# Patient Record
Sex: Female | Born: 1975 | Race: White | Hispanic: No | Marital: Married | Smoking: Never smoker
Health system: Southern US, Community
[De-identification: ages and names within clinical notes are randomized; demographics above are authoritative.]

## PROBLEM LIST (undated history)

## (undated) DIAGNOSIS — G373 Acute transverse myelitis in demyelinating disease of central nervous system: Secondary | ICD-10-CM

## (undated) DIAGNOSIS — E669 Obesity, unspecified: Secondary | ICD-10-CM

## (undated) HISTORY — PX: EYE SURGERY: SHX253

## (undated) HISTORY — PX: DILATION AND CURETTAGE OF UTERUS: SHX78

---

## 2009-03-09 ENCOUNTER — Ambulatory Visit: Payer: Self-pay | Admitting: Family Medicine

## 2009-06-29 ENCOUNTER — Emergency Department: Payer: Self-pay

## 2009-10-18 ENCOUNTER — Emergency Department: Payer: Self-pay | Admitting: Emergency Medicine

## 2010-07-09 ENCOUNTER — Emergency Department: Payer: Self-pay | Admitting: Emergency Medicine

## 2012-08-22 ENCOUNTER — Ambulatory Visit: Payer: Self-pay | Admitting: Family Medicine

## 2014-11-19 ENCOUNTER — Emergency Department: Payer: Self-pay | Admitting: Emergency Medicine

## 2014-11-19 LAB — CBC
HCT: 33.8 % — ABNORMAL LOW (ref 35.0–47.0)
HGB: 10.7 g/dL — ABNORMAL LOW (ref 12.0–16.0)
MCH: 23.6 pg — ABNORMAL LOW (ref 26.0–34.0)
MCHC: 31.5 g/dL — ABNORMAL LOW (ref 32.0–36.0)
MCV: 75 fL — ABNORMAL LOW (ref 80–100)
Platelet: 429 10*3/uL (ref 150–440)
RBC: 4.51 10*6/uL (ref 3.80–5.20)
RDW: 15.6 % — AB (ref 11.5–14.5)
WBC: 15 10*3/uL — ABNORMAL HIGH (ref 3.6–11.0)

## 2014-11-19 LAB — COMPREHENSIVE METABOLIC PANEL
ALBUMIN: 3.5 g/dL (ref 3.4–5.0)
AST: 10 U/L — AB (ref 15–37)
Alkaline Phosphatase: 114 U/L
Anion Gap: 7 (ref 7–16)
BUN: 8 mg/dL (ref 7–18)
Bilirubin,Total: 0.4 mg/dL (ref 0.2–1.0)
CHLORIDE: 106 mmol/L (ref 98–107)
CO2: 24 mmol/L (ref 21–32)
Calcium, Total: 8.7 mg/dL (ref 8.5–10.1)
Creatinine: 0.89 mg/dL (ref 0.60–1.30)
EGFR (African American): >60
Glucose: 103 mg/dL — ABNORMAL HIGH (ref 65–99)
Osmolality: 272 (ref 275–301)
Potassium: 4.1 mmol/L (ref 3.5–5.1)
SGPT (ALT): 21 U/L
Sodium: 137 mmol/L (ref 136–145)
Total Protein: 7.7 g/dL (ref 6.4–8.2)

## 2014-11-19 LAB — URINALYSIS, COMPLETE
BILIRUBIN, UR: NEGATIVE
BLOOD: NEGATIVE
Glucose,UR: NEGATIVE mg/dL (ref 0–75)
Nitrite: NEGATIVE
PROTEIN: NEGATIVE
Ph: 6 (ref 4.5–8.0)
Specific Gravity: 1.021 (ref 1.003–1.030)
WBC UR: 32 /HPF (ref 0–5)

## 2014-11-19 LAB — TROPONIN I

## 2014-11-19 LAB — LIPASE, BLOOD: LIPASE: 153 U/L (ref 73–393)

## 2014-11-22 ENCOUNTER — Emergency Department: Payer: Self-pay | Admitting: Emergency Medicine

## 2014-12-03 ENCOUNTER — Inpatient Hospital Stay (HOSPITAL_COMMUNITY)
Admission: EM | Admit: 2014-12-03 | Discharge: 2014-12-12 | DRG: 059 | Disposition: A | Discharge status: Home / Self Care | Payer: Medicaid Other | Attending: Internal Medicine | Admitting: Internal Medicine

## 2014-12-03 ENCOUNTER — Encounter (HOSPITAL_COMMUNITY): Payer: Self-pay | Admitting: Emergency Medicine

## 2014-12-03 DIAGNOSIS — E669 Obesity, unspecified: Secondary | ICD-10-CM

## 2014-12-03 DIAGNOSIS — R202 Paresthesia of skin: Secondary | ICD-10-CM

## 2014-12-03 DIAGNOSIS — G373 Acute transverse myelitis in demyelinating disease of central nervous system: Secondary | ICD-10-CM

## 2014-12-03 DIAGNOSIS — R7989 Other specified abnormal findings of blood chemistry: Secondary | ICD-10-CM

## 2014-12-03 DIAGNOSIS — G379 Demyelinating disease of central nervous system, unspecified: Principal | ICD-10-CM | POA: Diagnosis present

## 2014-12-03 DIAGNOSIS — G35 Multiple sclerosis: Secondary | ICD-10-CM

## 2014-12-03 DIAGNOSIS — R609 Edema, unspecified: Secondary | ICD-10-CM | POA: Insufficient documentation

## 2014-12-03 DIAGNOSIS — N390 Urinary tract infection, site not specified: Secondary | ICD-10-CM | POA: Insufficient documentation

## 2014-12-03 DIAGNOSIS — B373 Candidiasis of vulva and vagina: Secondary | ICD-10-CM | POA: Diagnosis present

## 2014-12-03 DIAGNOSIS — E282 Polycystic ovarian syndrome: Secondary | ICD-10-CM | POA: Diagnosis present

## 2014-12-03 DIAGNOSIS — Z820 Family history of epilepsy and other diseases of the nervous system: Secondary | ICD-10-CM

## 2014-12-03 DIAGNOSIS — E041 Nontoxic single thyroid nodule: Secondary | ICD-10-CM

## 2014-12-03 DIAGNOSIS — E8770 Fluid overload, unspecified: Secondary | ICD-10-CM | POA: Diagnosis not present

## 2014-12-03 DIAGNOSIS — Z6841 Body Mass Index (BMI) 40.0 and over, adult: Secondary | ICD-10-CM

## 2014-12-03 HISTORY — DX: Obesity, unspecified: E66.9

## 2014-12-03 LAB — COMPREHENSIVE METABOLIC PANEL
ALBUMIN: 3.6 g/dL (ref 3.5–5.2)
ALT: 23 U/L (ref 0–35)
ANION GAP: 7 (ref 5–15)
AST: 22 U/L (ref 0–37)
Alkaline Phosphatase: 91 U/L (ref 39–117)
BUN: 8 mg/dL (ref 6–23)
CO2: 28 mmol/L (ref 19–32)
CREATININE: 0.85 mg/dL (ref 0.50–1.10)
Calcium: 9.1 mg/dL (ref 8.4–10.5)
Chloride: 104 mEq/L (ref 96–112)
GFR calc Af Amer: >90 mL/min (ref >90)
GFR calc non Af Amer: 85 mL/min — ABNORMAL LOW (ref >90)
GLUCOSE: 137 mg/dL — AB (ref 70–99)
Potassium: 4 mmol/L (ref 3.5–5.1)
Sodium: 139 mmol/L (ref 135–145)
Total Bilirubin: 0.7 mg/dL (ref 0.3–1.2)
Total Protein: 6.8 g/dL (ref 6.0–8.3)

## 2014-12-03 LAB — CBC WITH DIFFERENTIAL/PLATELET
Basophils Absolute: 0 10*3/uL (ref 0.0–0.1)
Basophils Relative: 0 % (ref 0–1)
EOS ABS: 0.1 10*3/uL (ref 0.0–0.7)
Eosinophils Relative: 1 % (ref 0–5)
HCT: 33.7 % — ABNORMAL LOW (ref 36.0–46.0)
HEMOGLOBIN: 10.4 g/dL — AB (ref 12.0–15.0)
LYMPHS PCT: 25 % (ref 12–46)
Lymphs Abs: 2.3 10*3/uL (ref 0.7–4.0)
MCH: 23.9 pg — ABNORMAL LOW (ref 26.0–34.0)
MCHC: 30.9 g/dL (ref 30.0–36.0)
MCV: 77.3 fL — AB (ref 78.0–100.0)
MONOS PCT: 7 % (ref 3–12)
Monocytes Absolute: 0.6 10*3/uL (ref 0.1–1.0)
Neutro Abs: 6.2 10*3/uL (ref 1.7–7.7)
Neutrophils Relative %: 67 % (ref 43–77)
Platelets: 386 10*3/uL (ref 150–400)
RBC: 4.36 MIL/uL (ref 3.87–5.11)
RDW: 15.3 % (ref 11.5–15.5)
WBC: 9.2 10*3/uL (ref 4.0–10.5)

## 2014-12-03 NOTE — ED Notes (Signed)
Pt. reports numbness at lower torso , arms /legs and feet numbness and generalized weakness onset 3 weeks ago , ambulatory , denies fever , respirations unlabored .

## 2014-12-03 NOTE — ED Provider Notes (Signed)
CSN: [id number]     Arrival date & time 12/03/14  2127 History   First MD Initiated Contact with Patient 12/03/14 2214     Chief Complaint  Patient presents with  . Numbness     (Consider location/radiation/quality/duration/timing/severity/associated sxs/prior Treatment) HPI  PCP: Pcp Not In System Blood pressure 146/83, pulse 82, temperature 98.2 F (36.8 C), temperature source Oral, resp. rate 16, height  (1.626 m), weight 300 lb (136.079 kg), last menstrual period 11/26/2014, SpO2 99 %.  Jessica Cisneros is a 39 y.o.female without any significant PMH presents to the ER with complaints of paresthesias that started 3 weeks ago. The numbness/tingling started in her vaginal area, spread to her buttocks then down to her feet where it has progressively been moving up. Tonight, the symptoms moved up into her chest- which is why she presented to the ED.  She has been seen at [facility] x 2 for this. She reports having lab work done; diagnosed with UTI and vaginal yeast infection. She has finished treatment for this and was referred to Neuro both times if she did not feel better. She then followed up with her PCP who drew blood work, which reportedly was normal, and referred to Neurology. [facility] [place] [facility] said she did not know the etiology of her symptoms.  She called him this evening when the symptoms moved up into her chest and he told her to go to [facility] in [place], [place] to see our neurologist. She has not had any Head CT or MRI. She reports her neurologist told her to mention demyelinating disease and guillain barre.   Notably, she denies having any weakness or injury. She is ambulatory but restless. She has not had any fevers, headaches, nausea, vomiting or diarrhea. Denies any PMH.   Past Medical History  Diagnosis Date  . Obesity    Past Surgical History  Procedure Laterality Date  . Eye surgery    . Dilation and curettage  of uterus     No family history on file. History  Substance Use Topics  . Smoking status: Never Smoker   . Smokeless tobacco: Not on file  . Alcohol Use: No   OB History    No data available     Review of Systems  10 Systems reviewed and are negative for acute change except as noted in the HPI.   Allergies  Review of patient's allergies indicates no known allergies.  Home Medications   Prior to Admission medications   Not on File   BP 146/83 mmHg  Pulse 82  Temp(Src) 98.2 F (36.8 C) (Oral)  Resp 16  Ht  (1.626 m)  Wt 300 lb (136.079 kg)  BMI 51.47 kg/m2  SpO2 99%  LMP 11/26/2014 Physical Exam  Constitutional: She appears well-developed and well-nourished. No distress.  HENT:  Head: Normocephalic and atraumatic.  Eyes: Pupils are equal, round, and reactive to light.  Neck: Normal range of motion. Neck supple.  Cardiovascular: Normal rate and regular rhythm.   Pulmonary/Chest: Effort normal.  Abdominal: Soft.  Neurological: She is alert.  Cranial nerves II-VIII and X-XII evaluated and show no deficits. Pt alert and oriented x 3 Upper and lower extremity strength is symmetrical and physiologic Normal muscular tone No facial droop Coordination intact Rapid alternating movements normal  Skin: Skin is warm and dry.  Nursing note and vitals reviewed.   ED Course  Procedures (including critical care time) Labs Review  Labs Reviewed  CBC WITH DIFFERENTIAL - Abnormal; Notable for the following:    Hemoglobin 10.4 (*)    HCT 33.7 (*)    MCV 77.3 (*)    MCH 23.9 (*)    All other components within normal limits  COMPREHENSIVE METABOLIC PANEL - Abnormal; Notable for the following:    Glucose, Bld 137 (*)    GFR calc non Af Amer 85 (*)    All other components within normal limits    Imaging Review No results found.   EKG Interpretation None      MDM   Final diagnoses:  Paresthesias   11:00 pm I spoke with Dr. Cyril Mourning- [facility]. He as agreed to come see patient in the ED. She most likely will need MRI of brain and spinal cord. MRI is no longer here and pt will most likely need admission, will wait for recommendations.  11:30 pm Dr. Cyril Mourning has seen patient and has agreed to formally consult and follow patient.He requests patient being admitted to the medicine service  For head and spine MRI.  Patient is aware of the plan, that she needs to stay overnight and have MRIs done in the morning.  Filed Vitals:   12/03/14 2131  BP: 146/83  Pulse: 82  Temp: 98.2 F (36.8 C)  Resp: 16    I spoke with Dr. Nedra Hai who has agreed to admit the patient. He will put in orders.  Dorthula Matas, PA-C 12/03/14 2340  Toy Baker, MD 12/06/14 [id number]

## 2014-12-03 NOTE — Consult Note (Signed)
NEURO HOSPITALIST CONSULT NOTE    Reason for Consult: paresthesias legs-arms-genitalia area  HPI:                                                                                                                                          Jessica Cisneros is an 39 y.o. female without significant past medical history except for obesity, comes in for further evaluation of the above stated symptoms. She indicated that approximately 3 weeks ago, couple of days after a cold infection, she developed gradual onset of numbness-pins and needles in her genitalia area that subsequently progressed and traveled to her buttocks, feet, legs, and now anterior chest and arms. These symptoms have been pretty much constant ever since, perhaps less prominent around the genitalia.  No associated pain, bladder or bowel impairment, legs or arms weakness, HA, vertigo, double vision, difficultly swallowing, slurred speech, language or vision impairment. Michela Pitcher that she feels " a little bit off balance mainly because my legs are numb". Stated that she was seen by her neurologist about 1 week ago and MRI was planned. She called him this evening when the symptoms moved up into her chest and he told her to go to [facility] in [place], [place] to see our neurologist No recent fever, vaccinations, or rash.  Past Medical History  Diagnosis Date  . Obesity     Past Surgical History  Procedure Laterality Date  . Eye surgery    . Dilation and curettage of uterus      No family history on file.  Family History: no brain tumors, epilepsy, brain aneurysms, or multiple sclerosis   Social History:  reports that she has never smoked. She does not have any smokeless tobacco history on file. She reports that she does not drink alcohol or use illicit drugs.  No Known Allergies  MEDICATIONS:                                                                                                                     I  have reviewed the patient's current medications.   ROS:  History obtained from the patient  General ROS: negative for - chills, fatigue, fever, night sweats, or weight loss Psychological ROS: negative for - behavioral disorder, hallucinations, memory difficulties, mood swings or suicidal ideation Ophthalmic ROS: negative for - blurry vision, double vision, eye pain or loss of vision ENT ROS: negative for - epistaxis, nasal discharge, oral lesions, sore throat, tinnitus or vertigo Allergy and Immunology ROS: negative for - hives or itchy/watery eyes Hematological and Lymphatic ROS: negative for - bleeding problems, bruising or swollen lymph nodes Endocrine ROS: negative for - galactorrhea, hair pattern changes, polydipsia/polyuria or temperature intolerance Respiratory ROS: negative for - cough, hemoptysis, shortness of breath or wheezing Cardiovascular ROS: negative for - chest pain, dyspnea on exertion, edema or irregular heartbeat Gastrointestinal ROS: negative for - abdominal pain, diarrhea, hematemesis, nausea/vomiting or stool incontinence Genito-Urinary ROS: negative for - dysuria, hematuria, incontinence or urinary frequency/urgency Musculoskeletal ROS: negative for - joint swelling or muscular weakness Neurological ROS: as noted in HPI Dermatological ROS: negative for rash and skin lesion changes   Physical exam: pleasant female in no apparent distress. Blood pressure 146/83, pulse 82, temperature 98.2 F (36.8 C), temperature source Oral, resp. rate 16, height '5\' 4"'  (1.626 m), weight 136.079 kg (300 lb), last menstrual period 11/26/2014, SpO2 99 %. Head: normocephalic. Neck: supple, no bruits, no JVD. Cardiac: no murmurs. Lungs: clear. Abdomen: soft, no tender, no mass. Extremities: no edema. Skin: no rash CV: pulses palpable throughout   Neurologic Examination:                                                                                                      General: Mental Status: Alert, oriented, thought content appropriate.  Speech fluent without evidence of aphasia.  Able to follow 3 step commands without difficulty. Cranial Nerves: II: Discs flat bilaterally; Visual fields grossly normal, pupils equal, round, reactive to light and accommodation III,IV, VI: ptosis not present, extra-ocular motions intact bilaterally V,VII: smile symmetric, facial light touch sensation normal bilaterally VIII: hearing normal bilaterally IX,X: gag reflex present XI: bilateral shoulder shrug XII: midline tongue extension without atrophy or fasciculations Motor: Right : Upper extremity   5/5    Left:     Upper extremity   5/5  Lower extremity   5/5     Lower extremity   5/5 Tone and bulk:normal tone throughout; no atrophy noted Sensory: Pinprick intact but light touch " feels different" in the legs. Deep Tendon Reflexes:  Right: Upper Extremity   Left: Upper extremity   biceps (C-5 to C-6) 2/4   biceps (C-5 to C-6) 2/4 tricep (C7) 2/4    triceps (C7) 2/4 Brachioradialis (C6) 2/4  Brachioradialis (C6) 2/4  Lower Extremity Lower Extremity  quadriceps (L-2 to L-4) 2/4   quadriceps (L-2 to L-4) 2/4 Achilles (S1) 2/4   Achilles (S1) 2/4  Plantars: Right: downgoing   Left: downgoing Cerebellar: normal finger-to-nose,  normal heel-to-shin test Gait:  No tested for safety reasons    No results found for: CHOL  Results for orders placed or performed during the hospital encounter of  12/03/14 (from the past 48 hour(s))  CBC with Differential     Status: Abnormal   Collection Time: 12/03/14  9:33 PM  Result Value Ref Range   WBC 9.2 4.0 - 10.5 K/uL   RBC 4.36 3.87 - 5.11 MIL/uL   Hemoglobin 10.4 (L) 12.0 - 15.0 g/dL   HCT 33.7 (L) 36.0 - 46.0 %   MCV 77.3 (L) 78.0 - 100.0 fL   MCH 23.9 (L) 26.0 - 34.0 pg   MCHC 30.9 30.0 -  36.0 g/dL   RDW 15.3 11.5 - 15.5 %   Platelets 386 150 - 400 K/uL   Neutrophils Relative % 67 43 - 77 %   Neutro Abs 6.2 1.7 - 7.7 K/uL   Lymphocytes Relative 25 12 - 46 %   Lymphs Abs 2.3 0.7 - 4.0 K/uL   Monocytes Relative 7 3 - 12 %   Monocytes Absolute 0.6 0.1 - 1.0 K/uL   Eosinophils Relative 1 0 - 5 %   Eosinophils Absolute 0.1 0.0 - 0.7 K/uL   Basophils Relative 0 0 - 1 %   Basophils Absolute 0.0 0.0 - 0.1 K/uL  Comprehensive metabolic panel     Status: Abnormal   Collection Time: 12/03/14  9:33 PM  Result Value Ref Range   Sodium 139 135 - 145 mmol/L    Comment: Please note change in reference range.   Potassium 4.0 3.5 - 5.1 mmol/L    Comment: Please note change in reference range.   Chloride 104 96 - 112 mEq/L   CO2 28 19 - 32 mmol/L   Glucose, Bld 137 (H) 70 - 99 mg/dL   BUN 8 6 - 23 mg/dL   Creatinine, Ser 0.85 0.50 - 1.10 mg/dL   Calcium 9.1 8.4 - 10.5 mg/dL   Total Protein 6.8 6.0 - 8.3 g/dL   Albumin 3.6 3.5 - 5.2 g/dL   AST 22 0 - 37 U/L   ALT 23 0 - 35 U/L   Alkaline Phosphatase 91 39 - 117 U/L   Total Bilirubin 0.7 0.3 - 1.2 mg/dL   GFR calc non Af Amer 85 (L) >90 mL/min   GFR calc Af Amer >90 >90 mL/min    Comment: (NOTE) The eGFR has been calculated using the CKD EPI equation. This calculation has not been validated in all clinical situations. eGFR's persistently <90 mL/min signify possible Chronic Kidney Disease.    Anion gap 7 5 - 15    No results found.  Assessment/Plan: 39 years old obese female with new onset paresthesias for the past weeks that initially involved genitalia-buttocks-legs-arms and today moved to her chest. Neuro-exam is unimpressive, preserved DTR's. Of note, she claims that her symptoms developed couple of days after a cold. At this age a demyelinating disorder must be ruled out. Other inflammatory cord disorders also in the differential. Intrinsic primary cord mass less likely. I ordered MRI brain and cervico-thoracic cord  with and without contrast. Will follow up.  Dorian Pod, MD 12/03/2014, 11:30 PM  [facility]

## 2014-12-04 ENCOUNTER — Observation Stay (HOSPITAL_COMMUNITY): Payer: Medicaid Other

## 2014-12-04 ENCOUNTER — Encounter (HOSPITAL_COMMUNITY): Payer: Self-pay | Admitting: Internal Medicine

## 2014-12-04 DIAGNOSIS — E282 Polycystic ovarian syndrome: Secondary | ICD-10-CM | POA: Diagnosis present

## 2014-12-04 DIAGNOSIS — E669 Obesity, unspecified: Secondary | ICD-10-CM

## 2014-12-04 DIAGNOSIS — R202 Paresthesia of skin: Secondary | ICD-10-CM

## 2014-12-04 DIAGNOSIS — G0489 Other myelitis: Secondary | ICD-10-CM

## 2014-12-04 LAB — CSF CELL COUNT WITH DIFFERENTIAL
RBC COUNT CSF: 0 /mm3
Tube #: 3
WBC, CSF: 7 /mm3 — ABNORMAL HIGH (ref 0–5)

## 2014-12-04 LAB — PROTEIN, CSF: Total  Protein, CSF: 39 mg/dL (ref 15–45)

## 2014-12-04 LAB — RAPID URINE DRUG SCREEN, HOSP PERFORMED
AMPHETAMINES: NOT DETECTED
BARBITURATES: NOT DETECTED
Benzodiazepines: NOT DETECTED
Cocaine: NOT DETECTED
Opiates: NOT DETECTED
TETRAHYDROCANNABINOL: NOT DETECTED

## 2014-12-04 LAB — GLUCOSE, CSF: GLUCOSE CSF: 67 mg/dL (ref 43–76)

## 2014-12-04 LAB — GRAM STAIN

## 2014-12-04 LAB — CBC
HCT: 33.1 % — ABNORMAL LOW (ref 36.0–46.0)
Hemoglobin: 10.3 g/dL — ABNORMAL LOW (ref 12.0–15.0)
MCH: 24 pg — ABNORMAL LOW (ref 26.0–34.0)
MCHC: 31.1 g/dL (ref 30.0–36.0)
MCV: 77 fL — ABNORMAL LOW (ref 78.0–100.0)
Platelets: 345 10*3/uL (ref 150–400)
RBC: 4.3 MIL/uL (ref 3.87–5.11)
RDW: 15.3 % (ref 11.5–15.5)
WBC: 9 10*3/uL (ref 4.0–10.5)

## 2014-12-04 LAB — HCG, QUANTITATIVE, PREGNANCY

## 2014-12-04 LAB — VITAMIN B12: Vitamin B-12: 373 pg/mL (ref 211–911)

## 2014-12-04 LAB — TSH: TSH: 2.041 u[IU]/mL (ref 0.350–4.500)

## 2014-12-04 LAB — SEDIMENTATION RATE: Sed Rate: 27 mm/hr — ABNORMAL HIGH (ref 0–22)

## 2014-12-04 LAB — CREATININE, SERUM
Creatinine, Ser: 0.81 mg/dL (ref 0.50–1.10)
GFR calc Af Amer: >90 mL/min (ref >90)

## 2014-12-04 LAB — TESTOSTERONE: Testosterone: 44 ng/dL (ref 10–70)

## 2014-12-04 LAB — C-REACTIVE PROTEIN: CRP: 0.8 mg/dL — AB (ref <0.60)

## 2014-12-04 MED ORDER — HEPARIN SODIUM (PORCINE) 5000 UNIT/ML IJ SOLN
5000.0000 [IU] | Freq: Three times a day (TID) | INTRAMUSCULAR | Status: DC
  Administered 2014-12-04 – 2014-12-08 (×12): 5000 [IU] via SUBCUTANEOUS
  Filled 2014-12-04 (×12): qty 1

## 2014-12-04 MED ORDER — NYSTATIN 100000 UNIT/GM EX POWD
Freq: Two times a day (BID) | CUTANEOUS | Status: DC
  Administered 2014-12-04 – 2014-12-12 (×17): via TOPICAL
  Filled 2014-12-04 (×2): qty 15

## 2014-12-04 MED ORDER — GADOBENATE DIMEGLUMINE 529 MG/ML IV SOLN
20.0000 mL | Freq: Once | INTRAVENOUS | Status: AC | PRN
  Administered 2014-12-04: 20 mL via INTRAVENOUS

## 2014-12-04 MED ORDER — SODIUM CHLORIDE 0.9 % IV SOLN
1000.0000 mg | Freq: Every day | INTRAVENOUS | Status: AC
  Administered 2014-12-04 – 2014-12-08 (×5): 1000 mg via INTRAVENOUS
  Filled 2014-12-04 (×6): qty 8

## 2014-12-04 MED ORDER — HEPARIN SODIUM (PORCINE) 5000 UNIT/ML IJ SOLN: 5000.0000 [IU] | Freq: Three times a day (TID) | INTRAMUSCULAR | Status: DC

## 2014-12-04 MED ORDER — PANTOPRAZOLE SODIUM 40 MG IV SOLR
40.0000 mg | INTRAVENOUS | Status: DC
  Administered 2014-12-04 – 2014-12-05 (×2): 40 mg via INTRAVENOUS
  Filled 2014-12-04 (×2): qty 40

## 2014-12-04 NOTE — Progress Notes (Signed)
Patient Demographics  Jessica Cisneros, is a 39 y.o. female, DOB - [birth year], ZOX:[id number]  Admit date - 12/03/2014   Admitting Physician Houston Siren, MD  Outpatient Primary MD for the patient is Pcp Not In System  LOS - 1   Chief Complaint  Patient presents with  . Numbness       Admission history of present illness/brief narrative:  Jessica Cisneros is an 39 y.o. female  presents to the ER complaining of feeling numbness and tingling starting on her hip and progressing cephalad and now on her chest. She denied weakness, pain, blurry vision, stiffneck, fever, or chills. She has some HA, but no stiffneck. She denied any rash.  Evaluation in the ER showed normal serology. Neurology was consulted, recommended hospitalist to admit for further work up to include imaging. Her mother has Multiple Sclerosis. She has no weakness nor pain. 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  Subjective:   Jessica Cisneros today has, No headache, No chest pain, No abdominal pain - No Nausea, No new weakness tingling or numbness, No Cough - SOB.   Assessment & Plan    Active Problems:   Paresthesia   PCOS (polycystic ovarian syndrome)   Obesity  Paresthesia,  - MRI showing evidence of acute demyelination in C4-C6 . - Neurology on board,  -LP showing normal protein level and normal cell count. - Started on IV steroids by in neurology for possible autoimmune disorders ( transverse myelitis?) - TSH within normal limits, follow on RPR, Lyme titer, B-12.      Code Status: Full  Family Communication: Family  at bedside  Disposition Plan: Mains inpatient   Procedures  LP 1/22   Consults   Neurology   Medications  Scheduled Meds: . methylPREDNISolone (SOLU-MEDROL) injection  1,000 mg Intravenous Daily  . nystatin   Topical BID  . pantoprazole (PROTONIX) IV  40 mg Intravenous Q24H   Continuous Infusions:  PRN Meds:.  DVT Prophylaxis  subcutaneous heparin  Lab Results  Component Value Date   PLT 345 12/04/2014    Antibiotics    Anti-infectives    None          Objective:   Filed Vitals:   12/04/14 0030 12/04/14 0110 12/04/14 0455 12/04/14 1339  BP: 116/57  119/47 116/56  Pulse: 79  83 80  Temp:  98.1 F (36.7 C) 97.8 F (36.6 C) 99.5 F (37.5 C)  TempSrc:   Oral Other (Comment)  Resp: 23  20 20   Height:      Weight:      SpO2: 100%  97% 99%    Wt Readings from Last 3 Encounters:  12/03/14 136.079 kg (300 lb)     Intake/Output Summary (Last 24 hours) at 12/04/14 1538 Last data filed at 12/04/14 0840  Gross per 24 hour  Intake    200 ml  Output    400 ml  Net   -200 ml     Physical Exam  Awake Alert, Oriented X 3, No new F.N deficits, Normal affect [place].AT,PERRAL Supple Neck,No JVD, No cervical lymphadenopathy appriciated.  Symmetrical Chest wall movement, Good air movement bilaterally, CTAB RRR,No Gallops,Rubs or new Murmurs, No Parasternal Heave +ve B.Sounds, Abd Soft, No tenderness, No organomegaly appriciated, No rebound - guarding or rigidity. No Cyanosis, Clubbing or edema, No new Rash or bruise     Data Review   Micro Results Recent Results (from the past 240 hour(s))  Gram stain     Status: None   Collection Time: 12/04/14 12:32 PM  Result Value Ref Range Status   Specimen Description CSF  Final   Special Requests NONE  Final   Gram Stain   Final    WBC PRESENT, PREDOMINANTLY MONONUCLEAR NO ORGANISMS SEEN CYTOSPIN SLIDE    Report Status 12/04/2014 FINAL  Final    Radiology Reports Mr Laqueta Jean Wo Contrast  12/04/2014    CLINICAL DATA:  Numbness and tingling in hips progressing to chest bilaterally over 3 weeks.  EXAM: MRI HEAD WITHOUT AND WITH CONTRAST  MRI CERVICAL SPINE WITHOUT AND WITH CONTRAST  MRI THORACIC SPINE WITHOUT AND WITH CONTRAST  TECHNIQUE: Multiplanar, multiecho pulse sequences of the brain and surrounding structures, cervical and thoracic spine, to include the craniocervical junction, were obtained without and with intravenous contrast.  CONTRAST:  20mL MULTIHANCE GADOBENATE DIMEGLUMINE 529 MG/ML IV SOLN chest radiograph November 19, 2014 and lumbar spine radiographs November 19, 2014  COMPARISON:  None.  FINDINGS: MRI HEAD FINDINGS  The ventricles and sulci are normal for patient's age. No abnormal parenchymal signal, mass lesions, mass effect. No abnormal parenchymal enhancement. No reduced diffusion to suggest acute ischemia. No susceptibility artifact to suggest hemorrhage.  No abnormal extra-axial fluid collections. No extra-axial masses nor leptomeningeal enhancement. Normal major intracranial vascular flow voids seen at the skull base.  Ocular globes and orbital contents are unremarkable though not tailored for evaluation. No abnormal sellar expansion. Visualized paranasal sinuses and mastoid air cells are well-aerated. No suspicious calvarial bone marrow signal. No abnormal sellar expansion. Craniocervical junction maintained.  MRI CERVICAL SPINE FINDINGS  Expansile cervical spinal cord T2 bright, T1 hypointense lesion with homogeneous enhancement from C4 through C6, measuring 4.1 cm in craniocaudad dimension, with relative sparing of the lateral columns. No additional cervical spinal cord lesions identified. No syrinx. No abnormal leptomeningeal nor epidural enhancement.  Cervical vertebral bodies intact, straightened cervical lordosis. 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 Intervertebral discs demonstrate generally normal morphology, slightly decreased T2 signal consistent with desiccation. Mild  subacute discogenic endplate changes C3-4, C5-6 and a lesser extent at C6-7 without STIR signal abnormality to suggest acute osseous process. No abnormal osseous or intradiscal enhancement.  2.3 x 1.9 cm dominant LEFT thyroid nodule.  Level by level evaluation:  C2-3:  No disc bulge, canal stenosis or neural foraminal narrowing.  C3-4: 2 mm broad-based disc bulge, uncovertebral hypertrophy and mild LEFT facet arthropathy. Mild canal stenosis, moderate to severe LEFT neural foraminal narrowing.  C4-5: 1-2 mm broad-based disc bulge, uncovertebral hypertrophy and mild facet arthropathy. Mild canal stenosis, mild neural foraminal narrowing.  C5-6: 1-2 mm broad-based disc bulge, uncovertebral hypertrophy. Mild canal stenosis. Mild to moderate LEFT neural foraminal narrowing.  C6-7: Annular bulging, uncovertebral hypertrophy without canal stenosis or neural foraminal narrowing.  C7-T1: No disc bulge, canal stenosis nor neural foraminal narrowing.  MRI THORACIC SPINE FINDINGS  Thoracic vertebral bodies and posterior elements intact aligned 1 maintenance of thoracic kyphosis. Mild T7-8 disc height loss, with decreased  T2 signal suggesting mild desiccation. Mild acute enhancing discogenic endplate change at T5-6, subacute discogenic endplate changes T7-8. No STIR signal abnormality to suggest fracture. No abnormal osseous or intradiscal enhancement.  Thoracic spinal cord appears normal morphology and signal characteristics the conus medullaris which terminates at T12-L1. No abnormal cord, leptomeningeal or epidural enhancement. Prevertebral and paraspinal soft tissues are normal.  Tiny T5-6 central disc protrusion. 2 mm RIGHT T7 suspected disc extrusion partially characterized due to slice thickness. No canal stenosis or neural foraminal narrowing at any thoracic level.  IMPRESSION: MRI HEAD  Normal MRI of the brain without without contrast.  MRI CERVICAL SPINE  Expansile enhancing solitary cervical spinal cord lesion  spanning C4 through C6 with imaging characteristics of acute demyelination.  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 Neural foraminal narrowing C3-4 through C5-6: Moderate to severe on the LEFT at C3-4.  2.3 cm LEFT thyroid nodule for which follow up thyroid sonogram is recommended on a nonemergent basis.  MRI THORACIC SPINE  No MR findings of demyelination in the thoracic spinal cord.  Mild degenerative change of thoracic spine without canal stenosis or neural foraminal narrowing.   Electronically Signed   By: Awilda Metro   On: 12/04/2014 04:49   Mr Cervical Spine W Wo Contrast  12/04/2014   CLINICAL DATA:  Numbness and tingling in hips progressing to chest bilaterally over 3 weeks.  EXAM: MRI HEAD WITHOUT AND WITH CONTRAST  MRI CERVICAL SPINE WITHOUT AND WITH CONTRAST  MRI THORACIC SPINE WITHOUT AND WITH CONTRAST  TECHNIQUE: Multiplanar, multiecho pulse sequences of the brain and surrounding structures, cervical and thoracic spine, to include the craniocervical junction, were obtained without and with intravenous contrast.  CONTRAST:  20mL MULTIHANCE GADOBENATE DIMEGLUMINE 529 MG/ML IV SOLN chest radiograph November 19, 2014 and lumbar spine radiographs November 19, 2014  COMPARISON:  None.  FINDINGS: MRI HEAD FINDINGS  The ventricles and sulci are normal for patient's age. No abnormal parenchymal signal, mass lesions, mass effect. No abnormal parenchymal enhancement. No reduced diffusion to suggest acute ischemia. No susceptibility artifact to suggest hemorrhage.  No abnormal extra-axial fluid collections. No extra-axial masses nor leptomeningeal enhancement. Normal major intracranial vascular flow voids seen at the skull base.  Ocular globes and orbital contents are unremarkable though not tailored for evaluation. No abnormal sellar expansion. Visualized paranasal sinuses and mastoid air cells are well-aerated. No suspicious  calvarial bone marrow signal. No abnormal sellar expansion. Craniocervical junction maintained.  MRI CERVICAL SPINE FINDINGS  Expansile cervical spinal cord T2 bright, T1 hypointense lesion with homogeneous enhancement from C4 through C6, measuring 4.1 cm in craniocaudad dimension, with relative sparing of the lateral columns. No additional cervical spinal cord lesions identified. No syrinx. No abnormal leptomeningeal nor epidural enhancement.  Cervical vertebral bodies intact, straightened cervical lordosis. 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 Intervertebral discs demonstrate generally normal morphology, slightly decreased T2 signal consistent with desiccation. Mild subacute discogenic endplate changes C3-4, C5-6 and a lesser extent at C6-7 without STIR signal abnormality to suggest acute osseous process. No abnormal osseous or intradiscal enhancement.  2.3 x 1.9 cm dominant LEFT thyroid nodule.  Level by level evaluation:  C2-3:  No disc bulge, canal stenosis or neural foraminal narrowing.  C3-4: 2 mm broad-based disc bulge, uncovertebral hypertrophy and mild LEFT facet arthropathy. Mild canal stenosis, moderate to severe LEFT neural foraminal narrowing.  C4-5: 1-2 mm broad-based disc bulge, uncovertebral hypertrophy and mild facet arthropathy. Mild  canal stenosis, mild neural foraminal narrowing.  C5-6: 1-2 mm broad-based disc bulge, uncovertebral hypertrophy. Mild canal stenosis. Mild to moderate LEFT neural foraminal narrowing.  C6-7: Annular bulging, uncovertebral hypertrophy without canal stenosis or neural foraminal narrowing.  C7-T1: No disc bulge, canal stenosis nor neural foraminal narrowing.  MRI THORACIC SPINE FINDINGS  Thoracic vertebral bodies and posterior elements intact aligned 1 maintenance of thoracic kyphosis. Mild T7-8 disc height loss, with decreased T2 signal suggesting mild desiccation. Mild acute enhancing discogenic endplate change at T5-6, subacute discogenic endplate  changes K9-3. No STIR signal abnormality to suggest fracture. No abnormal osseous or intradiscal enhancement.  Thoracic spinal cord appears normal morphology and signal characteristics the conus medullaris which terminates at T12-L1. No abnormal cord, leptomeningeal or epidural enhancement. Prevertebral and paraspinal soft tissues are normal.  Tiny T5-6 central disc protrusion. 2 mm RIGHT T7 suspected disc extrusion partially characterized due to slice thickness. No canal stenosis or neural foraminal narrowing at any thoracic level.  IMPRESSION: MRI HEAD  Normal MRI of the brain without without contrast.  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 Neural foraminal narrowing C3-4 through C5-6: Moderate to severe on the LEFT at C3-4.  2.3 cm LEFT thyroid nodule for which follow up thyroid sonogram is recommended on a nonemergent basis.  MRI THORACIC SPINE  No MR findings of demyelination in the thoracic spinal cord.  Mild degenerative change of thoracic spine without canal stenosis or neural foraminal narrowing.   Electronically Signed   By: Awilda Metro   On: 12/04/2014 04:49   Mr Thoracic Spine W Wo Contrast  12/04/2014   CLINICAL DATA:  Numbness and tingling in hips progressing to chest bilaterally over 3 weeks.  EXAM: MRI HEAD WITHOUT AND WITH CONTRAST  MRI CERVICAL SPINE WITHOUT AND WITH CONTRAST  MRI THORACIC SPINE WITHOUT AND WITH CONTRAST  TECHNIQUE: Multiplanar, multiecho pulse sequences of the brain and surrounding structures, cervical and thoracic spine, to include the craniocervical junction, were obtained without and with intravenous contrast.  CONTRAST:  91mL MULTIHANCE GADOBENATE DIMEGLUMINE 529 MG/ML IV SOLN chest radiograph November 19, 2014 and lumbar spine radiographs  November 19, 2014  COMPARISON:  None.  FINDINGS: MRI HEAD FINDINGS  The ventricles and sulci are normal for patient's age. No abnormal parenchymal signal, mass lesions, mass effect. No abnormal parenchymal enhancement. No reduced diffusion to suggest acute ischemia. No susceptibility artifact to suggest hemorrhage.  No abnormal extra-axial fluid collections. No extra-axial masses nor leptomeningeal enhancement. Normal major intracranial vascular flow voids seen at the skull base.  Ocular globes and orbital contents are unremarkable though not tailored for evaluation. No abnormal sellar expansion. Visualized paranasal sinuses and mastoid air cells are well-aerated. No suspicious calvarial bone marrow signal. No abnormal sellar expansion. Craniocervical junction maintained.  MRI CERVICAL SPINE FINDINGS  Expansile cervical spinal cord T2 bright, T1 hypointense lesion with homogeneous enhancement from C4 through C6, measuring 4.1 cm in craniocaudad dimension, with relative sparing of the lateral columns. No additional cervical spinal cord lesions identified. No syrinx. No abnormal leptomeningeal nor epidural enhancement.  Cervical vertebral bodies intact, straightened cervical lordosis. 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 Intervertebral discs demonstrate generally normal morphology, slightly decreased T2 signal consistent with desiccation. Mild subacute discogenic endplate changes C3-4, C5-6 and a lesser extent at C6-7 without STIR signal abnormality  to suggest acute osseous process. No abnormal osseous or intradiscal enhancement.  2.3 x 1.9 cm dominant LEFT thyroid nodule.  Level by level evaluation:  C2-3:  No disc bulge, canal stenosis or neural foraminal narrowing.  C3-4: 2 mm broad-based disc bulge, uncovertebral hypertrophy and mild LEFT facet arthropathy. Mild canal stenosis, moderate to severe LEFT neural foraminal narrowing.  C4-5: 1-2 mm broad-based disc bulge, uncovertebral hypertrophy and mild  facet arthropathy. Mild canal stenosis, mild neural foraminal narrowing.  C5-6: 1-2 mm broad-based disc bulge, uncovertebral hypertrophy. Mild canal stenosis. Mild to moderate LEFT neural foraminal narrowing.  C6-7: Annular bulging, uncovertebral hypertrophy without canal stenosis or neural foraminal narrowing.  C7-T1: No disc bulge, canal stenosis nor neural foraminal narrowing.  MRI THORACIC SPINE FINDINGS  Thoracic vertebral bodies and posterior elements intact aligned 1 maintenance of thoracic kyphosis. Mild T7-8 disc height loss, with decreased T2 signal suggesting mild desiccation. Mild acute enhancing discogenic endplate change at T5-6, subacute discogenic endplate changes T7-8. No STIR signal abnormality to suggest fracture. No abnormal osseous or intradiscal enhancement.  Thoracic spinal cord appears normal morphology and signal characteristics the conus medullaris which terminates at T12-L1. No abnormal cord, leptomeningeal or epidural enhancement. Prevertebral and paraspinal soft tissues are normal.  Tiny T5-6 central disc protrusion. 2 mm RIGHT T7 suspected disc extrusion partially characterized due to slice thickness. No canal stenosis or neural foraminal narrowing at any thoracic level.  IMPRESSION: MRI HEAD  Normal MRI of the brain without without contrast.  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 Neural foraminal narrowing C3-4 through C5-6: Moderate to severe on the LEFT at C3-4.  2.3 cm LEFT thyroid nodule for which follow up thyroid sonogram is recommended on a nonemergent basis.  MRI THORACIC SPINE  No MR findings of demyelination in the thoracic spinal cord.  Mild degenerative change of thoracic spine without canal stenosis or neural foraminal narrowing.   Electronically  Signed   By: Awilda Metro   On: 12/04/2014 04:49    CBC  Recent Labs Lab 12/03/14 2133 12/04/14 0720  WBC 9.2 9.0  HGB 10.4* 10.3*  HCT 33.7* 33.1*  PLT 386 345  MCV 77.3* 77.0*  MCH 23.9* 24.0*  MCHC 30.9 31.1  RDW 15.3 15.3  LYMPHSABS 2.3  --   MONOABS 0.6  --   EOSABS 0.1  --   BASOSABS 0.0  --     Chemistries   Recent Labs Lab 12/03/14 2133 12/04/14 0720  NA 139  --   K 4.0  --   CL 104  --   CO2 28  --   GLUCOSE 137*  --   BUN 8  --   CREATININE 0.85 0.81  CALCIUM 9.1  --   AST 22  --   ALT 23  --   ALKPHOS 91  --   BILITOT 0.7  --    ------------------------------------------------------------------------------------------------------------------ estimated creatinine clearance is 128.5 mL/min (by C-G formula based on Cr of 0.81). ------------------------------------------------------------------------------------------------------------------ No results for input(s): HGBA1C in the last 72 hours. ------------------------------------------------------------------------------------------------------------------ No results for input(s): CHOL, HDL, LDLCALC, TRIG, CHOLHDL, LDLDIRECT in the last 72 hours. ------------------------------------------------------------------------------------------------------------------  Recent Labs  12/04/14 0720  TSH 2.041   ------------------------------------------------------------------------------------------------------------------ No results for input(s): VITAMINB12, FOLATE, FERRITIN, TIBC, IRON, RETICCTPCT in the last 72 hours.  Coagulation profile No results  for input(s): INR, PROTIME in the last 168 hours.  No results for input(s): DDIMER in the last 72 hours.  Cardiac Enzymes No results for input(s): CKMB, TROPONINI, MYOGLOBIN in the last 168 hours.  Invalid input(s): CK ------------------------------------------------------------------------------------------------------------------ Invalid input(s):  POCBNP     Time Spent in minutes   30 minutes   Arsalan Brisbin M.D on 12/04/2014 at 3:38 PM  Between 7am to 7pm - Pager - [phone]  After 7pm go to [email or web address] - password [email or web address]  And look for the night coverage person covering for me after hours  [facility] Office  [phone]   **Disclaimer: This note may have been dictated with voice recognition software. Similar sounding words can inadvertently be transcribed and this note may contain transcription errors which may not have been corrected upon publication of note.**

## 2014-12-04 NOTE — ED Notes (Addendum)
Patient at MRI 

## 2014-12-04 NOTE — Progress Notes (Signed)
UR completed 

## 2014-12-04 NOTE — Procedures (Signed)
LP L2/3 10 cc clear fluid Openign P - 20 cm water. Comp - none

## 2014-12-04 NOTE — Progress Notes (Signed)
Pt is admitted to [facility] from ED. Family is with patient. She is alert and oriented and has no complaints of pain. She is stating that she has numbness all over her body except from her neck up.  Will Continue to monitor.

## 2014-12-04 NOTE — Procedures (Signed)
LP Procedure Note:  Patient has been seen and examined.  Chart has been reviewed.  LP is being performed to inflammatory cells or demyelinating disorder.  Procedure has been explained to patient/family including risks and benefits.  Consent has been signed by patient/family and witnessed.   Blood pressure 119/47, pulse 83, temperature 97.8 F (36.6 C), temperature source Oral, resp. rate 20, height 5\' 4"  (1.626 m), weight 136.079 kg (300 lb), last menstrual period 11/26/2014, SpO2 97 %.   Current facility-administered medications:  .  heparin injection 5,000 Units, 5,000 Units, Subcutaneous, 3 times per day, Houston Siren, MD   Recent Labs  12/03/14 2133 12/04/14 0720  WBC 9.2 9.0  HGB 10.4* 10.3*  HCT 33.7* 33.1*  PLT 386 345    MRI of head: MRI HEAD  Normal MRI of the brain without without contrast.   Patient was placed in the lateral decub/sitting position.  Area was cleaned with betadine and anesthetized with lidocaine. Under sterile conditions 20G LP needle was placed at approximately L3-4 with difficulty finding disc interspace and need was not able to be advanced.  No fluid was obtained.   No complications were noted.     Felicie Morn PA-C [facility] [phone]  12/04/2014, 9:39 AM  Patient seen and examined.  Clinical course and management discussed.    Thana Farr, MD [facility] [phone]  12/04/2014  1:12 PM

## 2014-12-04 NOTE — Progress Notes (Signed)
Nutrition Brief Note  Patient identified on the Malnutrition Screening Tool (MST) Report  Wt Readings from Last 15 Encounters:  12/03/14 300 lb (136.079 kg)    Body mass index is 51.47 kg/(m^2). Patient meets criteria for Morbid Obesity based on current BMI. Pt reported minimal weight loss but, denied having a decreased appetite.   Current diet order is Regular, patient is consuming approximately 100% of meals at this time. Labs and medications reviewed.   No nutrition interventions warranted at this time. If nutrition issues arise, please consult RD.   Ian Malkin RD, LDN Inpatient Clinical Dietitian Pager: [phone] After Hours Pager: [phone]

## 2014-12-04 NOTE — H&P (Addendum)
[facility] History and Physical  RAMIE PALLADINO YBW:[id number] DOB: June 09, 1976    PCP:   Pcp Not In System   Chief Complaint: Ascending paresthesia.  HPI: Jessica Cisneros is an 39 y.o. female with benign PMH on no chronic meds but perhaps by virtue of hardly ever saw any physician, presents to the ER complaining of feeling numbness and tingling starting on her hip and progressing cephalad and now on her chest.  She denied weakness, pain, blurry vision, stiffneck, fever, or chills.  She has some HA, but no stiffneck.  She denied any rash.  She has 2 cats and no dog.  No tick bite, no ill contact, and no distant travel. She drinks bottle water usually, and denied drug, alcohol or tobacco use.  Evaluation in the ER showed normal serology.  Neurology was consulted, recommended hospitalist to admit for further work up to include imaging.  She was seen at [facility] ER x2.  Her mother has Multiple Sclerosis.  She has no weakness nor pain.  It should be noted that her period has been irregular and that she has hirsutism.  She did have 2 children and one miscarriage.  Rewiew of Systems:  Constitutional: Negative for malaise, fever and chills. No significant weight loss or weight gain Eyes: Negative for eye pain, redness and discharge, diplopia, visual changes, or flashes of light. ENMT: Negative for ear pain, hoarseness, nasal congestion, sinus pressure and sore throat. No headaches; tinnitus, drooling, or problem swallowing. Cardiovascular: Negative for chest pain, palpitations, diaphoresis, dyspnea and peripheral edema. ; No orthopnea, PND Respiratory: Negative for cough, hemoptysis, wheezing and stridor. No pleuritic chestpain. Gastrointestinal: Negative for nausea, vomiting, diarrhea, constipation, abdominal pain, melena, blood in stool, hematemesis, jaundice and rectal bleeding.    Genitourinary: Negative for frequency, dysuria, incontinence,flank pain and hematuria; Musculoskeletal: Negative for  back pain and neck pain. Negative for swelling and trauma.;  Skin: . Negative for pruritus, rash, abrasions, bruising and skin lesion.; ulcerations Neuro: Negative for headache, lightheadedness and neck stiffness. Negative for weakness, altered level of consciousness , altered mental status, extremity weakness, burning feet, involuntary movement, seizure and syncope.  Psych: negative for anxiety, depression, insomnia, tearfulness, panic attacks, hallucinations, paranoia, suicidal or homicidal ideation    Past Medical History  Diagnosis Date  . Obesity     Past Surgical History  Procedure Laterality Date  . Eye surgery    . Dilation and curettage of uterus      Medications:  HOME MEDS: Prior to Admission medications   Not on File     Allergies:  No Known Allergies  Social History:   reports that she has never smoked. She does not have any smokeless tobacco history on file. She reports that she does not drink alcohol or use illicit drugs.  Family History: History reviewed. No pertinent family history.   Physical Exam: Filed Vitals:   12/03/14 2131  BP: 146/83  Pulse: 82  Temp: 98.2 F (36.8 C)  TempSrc: Oral  Resp: 16  Height: '5\' 4"'  (1.626 m)  Weight: 136.079 kg (300 lb)  SpO2: 99%   Blood pressure 146/83, pulse 82, temperature 98.2 F (36.8 C), temperature source Oral, resp. rate 16, height '5\' 4"'  (1.626 m), weight 136.079 kg (300 lb), last menstrual period 11/26/2014, SpO2 99 %.  GEN:  Pleasant  patient lying in the stretcher in no acute distress; cooperative with exam. PSYCH:  alert and oriented x4; does not appear anxious or depressed; affect is appropriate. HEENT: Mucous membranes  pink and anicteric; PERRLA; EOM intact; no cervical lymphadenopathy nor thyromegaly or carotid bruit; no JVD; There were no stridor. Neck is very supple. Breasts:: Not examined CHEST WALL: No tenderness CHEST: Normal respiration, clear to auscultation bilaterally.  HEART: Regular  rate and rhythm.  There are no murmur, rub, or gallops.   BACK: No kyphosis or scoliosis; no CVA tenderness ABDOMEN: soft and non-tender; no masses, no organomegaly, normal abdominal bowel sounds; no pannus; no intertriginous candida. There is no rebound and no distention. Rectal Exam: Not done EXTREMITIES: No bone or joint deformity; age-appropriate arthropathy of the hands and knees; no edema; no ulcerations.  There is no calf tenderness. Genitalia: not examined PULSES: 2+ and symmetric SKIN: Normal hydration no rash or ulceration CNS: Cranial nerves 2-12 grossly intact no focal lateralizing neurologic deficit.  Speech is fluent; uvula elevated with phonation, facial symmetry and tongue midline. DTR are normal bilaterally, cerebella exam is intact, barbinski is negative and strengths are equaled bilaterally.  No sensory loss.   Labs on Admission:  Basic Metabolic Panel:  Recent Labs Lab 12/03/14 2133  NA 139  K 4.0  CL 104  CO2 28  GLUCOSE 137*  BUN 8  CREATININE 0.85  CALCIUM 9.1   Liver Function Tests:  Recent Labs Lab 12/03/14 2133  AST 22  ALT 23  ALKPHOS 91  BILITOT 0.7  PROT 6.8  ALBUMIN 3.6    Recent Labs Lab 12/03/14 2133  WBC 9.2  NEUTROABS 6.2  HGB 10.4*  HCT 33.7*  MCV 77.3*  PLT 386    Assessment/Plan Present on Admission:  . Paresthesia . PCOS (polycystic ovarian syndrome)  PLAN:  Will admit her for further work up.  I am not sure the etiology of her ascending paresthesia.  Differential includes MS, Devic's syndrome (NMO), conversion disorder, other neuritis, infections,  Will obtain MRI of the brain, spinal cord, and obtain HIV, B12, thiamine level, ESR, Lyme titer, RPR.  Neurology will make further recommendation.  Her other diagnosis unrelated to her current complaints would include likely PCOS and sleep apnea.  Will obtain testosterone level, UDS, and pregnancy test.  Thank you and good day.  Other plans as per orders.  Code Status: FULL  Haskel Khan, MD. [facility] Pager [phone] 7pm to 7am.  12/04/2014, 12:13 AM

## 2014-12-05 DIAGNOSIS — N390 Urinary tract infection, site not specified: Secondary | ICD-10-CM | POA: Diagnosis present

## 2014-12-05 DIAGNOSIS — G379 Demyelinating disease of central nervous system, unspecified: Secondary | ICD-10-CM | POA: Diagnosis present

## 2014-12-05 DIAGNOSIS — E282 Polycystic ovarian syndrome: Secondary | ICD-10-CM | POA: Diagnosis present

## 2014-12-05 DIAGNOSIS — R202 Paresthesia of skin: Secondary | ICD-10-CM | POA: Diagnosis present

## 2014-12-05 DIAGNOSIS — R7989 Other specified abnormal findings of blood chemistry: Secondary | ICD-10-CM

## 2014-12-05 DIAGNOSIS — B373 Candidiasis of vulva and vagina: Secondary | ICD-10-CM | POA: Diagnosis present

## 2014-12-05 DIAGNOSIS — Z6841 Body Mass Index (BMI) 40.0 and over, adult: Secondary | ICD-10-CM | POA: Diagnosis not present

## 2014-12-05 DIAGNOSIS — E041 Nontoxic single thyroid nodule: Secondary | ICD-10-CM | POA: Diagnosis present

## 2014-12-05 DIAGNOSIS — E8770 Fluid overload, unspecified: Secondary | ICD-10-CM | POA: Diagnosis not present

## 2014-12-05 DIAGNOSIS — Z820 Family history of epilepsy and other diseases of the nervous system: Secondary | ICD-10-CM | POA: Diagnosis not present

## 2014-12-05 DIAGNOSIS — G0489 Other myelitis: Secondary | ICD-10-CM

## 2014-12-05 LAB — CBC
HCT: 36.4 % (ref 36.0–46.0)
HEMOGLOBIN: 11.3 g/dL — AB (ref 12.0–15.0)
MCH: 23.9 pg — ABNORMAL LOW (ref 26.0–34.0)
MCHC: 31 g/dL (ref 30.0–36.0)
MCV: 77 fL — AB (ref 78.0–100.0)
PLATELETS: 407 10*3/uL — AB (ref 150–400)
RBC: 4.73 MIL/uL (ref 3.87–5.11)
RDW: 15.3 % (ref 11.5–15.5)
WBC: 9.6 10*3/uL (ref 4.0–10.5)

## 2014-12-05 LAB — BASIC METABOLIC PANEL
Anion gap: 8 (ref 5–15)
BUN: 8 mg/dL (ref 6–23)
CHLORIDE: 107 mmol/L (ref 96–112)
CO2: 23 mmol/L (ref 19–32)
Calcium: 9.2 mg/dL (ref 8.4–10.5)
Creatinine, Ser: 0.8 mg/dL (ref 0.50–1.10)
GFR calc Af Amer: >90 mL/min (ref >90)
GLUCOSE: 185 mg/dL — AB (ref 70–99)
POTASSIUM: 4 mmol/L (ref 3.5–5.1)
Sodium: 138 mmol/L (ref 135–145)

## 2014-12-05 LAB — VITAMIN D 25 HYDROXY (VIT D DEFICIENCY, FRACTURES): VIT D 25 HYDROXY: 4.3 ng/mL — AB (ref 30.0–100.0)

## 2014-12-05 LAB — SEX HORMONE BINDING GLOBULIN: Sex Hormone Binding: 29 nmol/L (ref 17–124)

## 2014-12-05 MED ORDER — VITAMIN D (ERGOCALCIFEROL) 1.25 MG (50000 UNIT) PO CAPS
50000.0000 [IU] | ORAL_CAPSULE | ORAL | Status: DC
  Administered 2014-12-05 – 2014-12-12 (×2): 50000 [IU] via ORAL
  Filled 2014-12-05 (×4): qty 1

## 2014-12-05 NOTE — Progress Notes (Signed)
Subjective: Patient unchanged.  No improvement or worsening of paresthesias.  Solumedrol initiated.  Objective: Current vital signs: BP 123/65 mmHg  Pulse 112  Temp(Src) 98.4 F (36.9 C) (Oral)  Resp 18  Ht '5\' 4"'  (1.626 m)  Wt 136.079 kg (300 lb)  BMI 51.47 kg/m2  SpO2 96%  LMP 11/26/2014 Vital signs in last 24 hours: Temp:  [98.4 F (36.9 C)-99.5 F (37.5 C)] 98.4 F (36.9 C) (01/23 0558) Pulse Rate:  [80-112] 112 (01/23 1120) Resp:  [18-20] 18 (01/23 1120) BP: (116-158)/(50-73) 123/65 mmHg (01/23 1120) SpO2:  [94 %-99 %] 96 % (01/23 1120)  Intake/Output from previous day: 01/22 0701 - 01/23 0700 In: 200 [P.O.:200] Out: -  Intake/Output this shift:   Nutritional status: Diet regular  Neurologic Exam: General:  NAD Mental Status: Alert, oriented, thought content appropriate. Speech fluent without evidence of aphasia. Able to follow 3 step commands without difficulty. Cranial Nerves: II: Discs flat bilaterally; Visual fields grossly normal, pupils equal, round, reactive to light and accommodation III,IV, VI: ptosis not present, extra-ocular motions intact bilaterally V,VII: smile symmetric, facial light touch sensation normal bilaterally VIII: hearing normal bilaterally IX,X: gag reflex present XI: bilateral shoulder shrug XII: midline tongue extension without atrophy or fasciculations Motor: 5/5 throughout Sensory: Decreased sensation below, above the breast line  Deep Tendon Reflexes:  2+ throughout Plantars: Right: downgoingLeft: downgoing Cerebellar: normal finger-to-nose and normal heel-to-shin testing bilaterally   Lab Results: Basic Metabolic Panel:  Recent Labs Lab 12/03/14 2133 12/04/14 0720 12/05/14 0446  NA 139  --  138  K 4.0  --  4.0  CL 104  --  107  CO2 28  --  23  GLUCOSE 137*  --  185*  BUN 8  --  8  CREATININE 0.85 0.81 0.80  CALCIUM 9.1  --  9.2    Liver Function Tests:  Recent Labs Lab  12/03/14 2133  AST 22  ALT 23  ALKPHOS 91  BILITOT 0.7  PROT 6.8  ALBUMIN 3.6   No results for input(s): LIPASE, AMYLASE in the last 168 hours. No results for input(s): AMMONIA in the last 168 hours.  CBC:  Recent Labs Lab 12/03/14 2133 12/04/14 0720 12/05/14 0446  WBC 9.2 9.0 9.6  NEUTROABS 6.2  --   --   HGB 10.4* 10.3* 11.3*  HCT 33.7* 33.1* 36.4  MCV 77.3* 77.0* 77.0*  PLT 386 345 407*    Cardiac Enzymes: No results for input(s): CKTOTAL, CKMB, CKMBINDEX, TROPONINI in the last 168 hours.  Lipid Panel: No results for input(s): CHOL, TRIG, HDL, CHOLHDL, VLDL, LDLCALC in the last 168 hours.  CBG: No results for input(s): GLUCAP in the last 168 hours.  Microbiology: Results for orders placed or performed during the hospital encounter of 12/03/14  CSF culture     Status: None (Preliminary result)   Collection Time: 12/04/14 12:32 PM  Result Value Ref Range Status   Specimen Description CSF  Final   Special Requests CSF FLUID NO2 2.5ML  Final   Gram Stain PENDING  Incomplete   Culture   Final    NO GROWTH 1 DAY Performed at [facility]    Report Status PENDING  Incomplete  Gram stain     Status: None   Collection Time: 12/04/14 12:32 PM  Result Value Ref Range Status   Specimen Description CSF  Final   Special Requests NONE  Final   Gram Stain   Final    WBC PRESENT, PREDOMINANTLY MONONUCLEAR  NO ORGANISMS SEEN CYTOSPIN SLIDE    Report Status 12/04/2014 FINAL  Final    Coagulation Studies: No results for input(s): LABPROT, INR in the last 72 hours.  Imaging: Mr Jeri Cos Wo Contrast  12/04/2014   CLINICAL DATA:  Numbness and tingling in hips progressing to chest bilaterally over 3 weeks.  EXAM: MRI HEAD WITHOUT AND WITH CONTRAST  MRI CERVICAL SPINE WITHOUT AND WITH CONTRAST  MRI THORACIC SPINE WITHOUT AND WITH CONTRAST  TECHNIQUE: Multiplanar, multiecho pulse sequences of the brain and surrounding structures, cervical and thoracic spine, to  include the craniocervical junction, were obtained without and with intravenous contrast.  CONTRAST:  86m MULTIHANCE GADOBENATE DIMEGLUMINE 529 MG/ML IV SOLN chest radiograph November 19, 2014 and lumbar spine radiographs November 19, 2014  COMPARISON:  None.  FINDINGS: MRI HEAD FINDINGS  The ventricles and sulci are normal for patient's age. No abnormal parenchymal signal, mass lesions, mass effect. No abnormal parenchymal enhancement. No reduced diffusion to suggest acute ischemia. No susceptibility artifact to suggest hemorrhage.  No abnormal extra-axial fluid collections. No extra-axial masses nor leptomeningeal enhancement. Normal major intracranial vascular flow voids seen at the skull base.  Ocular globes and orbital contents are unremarkable though not tailored for evaluation. No abnormal sellar expansion. Visualized paranasal sinuses and mastoid air cells are well-aerated. No suspicious calvarial bone marrow signal. No abnormal sellar expansion. Craniocervical junction maintained.  MRI CERVICAL SPINE FINDINGS  Expansile cervical spinal cord T2 bright, T1 hypointense lesion with homogeneous enhancement from C4 through C6, measuring 4.1 cm in craniocaudad dimension, with relative sparing of the lateral columns. No additional cervical spinal cord lesions identified. No syrinx. No abnormal leptomeningeal nor epidural enhancement.  Cervical vertebral bodies intact, straightened cervical lordosis. 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 Intervertebral discs demonstrate generally normal morphology, slightly decreased T2 signal consistent with desiccation. Mild subacute discogenic endplate changes CT2-6 CZ1-2and a lesser extent at C6-7 without STIR signal abnormality to suggest acute osseous process. No abnormal osseous or intradiscal enhancement.  2.3 x 1.9 cm dominant LEFT thyroid nodule.  Level by level evaluation:  C2-3:  No disc bulge, canal stenosis or neural foraminal narrowing.  C3-4: 2 mm  broad-based disc bulge, uncovertebral hypertrophy and mild LEFT facet arthropathy. Mild canal stenosis, moderate to severe LEFT neural foraminal narrowing.  C4-5: 1-2 mm broad-based disc bulge, uncovertebral hypertrophy and mild facet arthropathy. Mild canal stenosis, mild neural foraminal narrowing.  C5-6: 1-2 mm broad-based disc bulge, uncovertebral hypertrophy. Mild canal stenosis. Mild to moderate LEFT neural foraminal narrowing.  C6-7: Annular bulging, uncovertebral hypertrophy without canal stenosis or neural foraminal narrowing.  C7-T1: No disc bulge, canal stenosis nor neural foraminal narrowing.  MRI THORACIC SPINE FINDINGS  Thoracic vertebral bodies and posterior elements intact aligned 1 maintenance of thoracic kyphosis. Mild T7-8 disc height loss, with decreased T2 signal suggesting mild desiccation. Mild acute enhancing discogenic endplate change at TW5-8 subacute discogenic endplate changes TK9-9 No STIR signal abnormality to suggest fracture. No abnormal osseous or intradiscal enhancement.  Thoracic spinal cord appears normal morphology and signal characteristics the conus medullaris which terminates at T12-L1. No abnormal cord, leptomeningeal or epidural enhancement. Prevertebral and paraspinal soft tissues are normal.  Tiny T5-6 central disc protrusion. 2 mm RIGHT T7 suspected disc extrusion partially characterized due to slice thickness. No canal stenosis or neural foraminal narrowing at any thoracic level.  IMPRESSION: MRI HEAD  Normal MRI of the brain without without contrast.  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 Neural foraminal narrowing C3-4 through C5-6: Moderate to severe on the LEFT at C3-4.  2.3 cm LEFT thyroid nodule for which follow up thyroid sonogram is  recommended on a nonemergent basis.  MRI THORACIC SPINE  No MR findings of demyelination in the thoracic spinal cord.  Mild degenerative change of thoracic spine without canal stenosis or neural foraminal narrowing.   Electronically Signed   By: Elon Alas   On: 12/04/2014 04:49   Mr Cervical Spine W Wo Contrast  12/04/2014   CLINICAL DATA:  Numbness and tingling in hips progressing to chest bilaterally over 3 weeks.  EXAM: MRI HEAD WITHOUT AND WITH CONTRAST  MRI CERVICAL SPINE WITHOUT AND WITH CONTRAST  MRI THORACIC SPINE WITHOUT AND WITH CONTRAST  TECHNIQUE: Multiplanar, multiecho pulse sequences of the brain and surrounding structures, cervical and thoracic spine, to include the craniocervical junction, were obtained without and with intravenous contrast.  CONTRAST:  60m MULTIHANCE GADOBENATE DIMEGLUMINE 529 MG/ML IV SOLN chest radiograph November 19, 2014 and lumbar spine radiographs November 19, 2014  COMPARISON:  None.  FINDINGS: MRI HEAD FINDINGS  The ventricles and sulci are normal for patient's age. No abnormal parenchymal signal, mass lesions, mass effect. No abnormal parenchymal enhancement. No reduced diffusion to suggest acute ischemia. No susceptibility artifact to suggest hemorrhage.  No abnormal extra-axial fluid collections. No extra-axial masses nor leptomeningeal enhancement. Normal major intracranial vascular flow voids seen at the skull base.  Ocular globes and orbital contents are unremarkable though not tailored for evaluation. No abnormal sellar expansion. Visualized paranasal sinuses and mastoid air cells are well-aerated. No suspicious calvarial bone marrow signal. No abnormal sellar expansion. Craniocervical junction maintained.  MRI CERVICAL SPINE FINDINGS  Expansile cervical spinal cord T2 bright, T1 hypointense lesion with homogeneous enhancement from C4 through C6, measuring 4.1 cm in craniocaudad dimension, with relative sparing of the lateral columns. No additional cervical  spinal cord lesions identified. No syrinx. No abnormal leptomeningeal nor epidural enhancement.  Cervical vertebral bodies intact, straightened cervical lordosis. 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 Intervertebral discs demonstrate generally normal morphology, slightly decreased T2 signal consistent with desiccation. Mild subacute discogenic endplate changes CK9-1 CP9-1and a lesser extent at C6-7 without STIR signal abnormality to suggest acute osseous process. No abnormal osseous or intradiscal enhancement.  2.3 x 1.9 cm dominant LEFT thyroid nodule.  Level by level evaluation:  C2-3:  No disc bulge, canal stenosis or neural foraminal narrowing.  C3-4: 2 mm broad-based disc bulge, uncovertebral hypertrophy and mild LEFT facet arthropathy. Mild canal stenosis, moderate to severe LEFT neural foraminal narrowing.  C4-5: 1-2 mm broad-based disc bulge, uncovertebral hypertrophy and mild facet arthropathy. Mild canal stenosis, mild neural foraminal narrowing.  C5-6: 1-2 mm broad-based disc bulge, uncovertebral hypertrophy. Mild canal stenosis. Mild to moderate LEFT neural foraminal narrowing.  C6-7: Annular bulging, uncovertebral hypertrophy without canal stenosis or neural foraminal narrowing.  C7-T1: No disc bulge, canal stenosis nor neural foraminal narrowing.  MRI THORACIC SPINE FINDINGS  Thoracic vertebral bodies and posterior elements intact aligned 1 maintenance of thoracic kyphosis. Mild T7-8 disc height loss, with decreased T2 signal suggesting mild desiccation. Mild acute enhancing discogenic endplate change at TT0-5 subacute discogenic endplate changes TW9-7 No STIR signal abnormality to suggest fracture. No abnormal osseous or intradiscal enhancement.  Thoracic spinal cord appears normal morphology and signal characteristics  the conus medullaris which terminates at T12-L1. No abnormal cord, leptomeningeal or epidural enhancement. Prevertebral and paraspinal soft tissues are normal.  Tiny T5-6  central disc protrusion. 2 mm RIGHT T7 suspected disc extrusion partially characterized due to slice thickness. No canal stenosis or neural foraminal narrowing at any thoracic level.  IMPRESSION: MRI HEAD  Normal MRI of the brain without without contrast.  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 Neural foraminal narrowing C3-4 through C5-6: Moderate to severe on the LEFT at C3-4.  2.3 cm LEFT thyroid nodule for which follow up thyroid sonogram is recommended on a nonemergent basis.  MRI THORACIC SPINE  No MR findings of demyelination in the thoracic spinal cord.  Mild degenerative change of thoracic spine without canal stenosis or neural foraminal narrowing.   Electronically Signed   By: Elon Alas   On: 12/04/2014 04:49   Mr Thoracic Spine W Wo Contrast  12/04/2014   CLINICAL DATA:  Numbness and tingling in hips progressing to chest bilaterally over 3 weeks.  EXAM: MRI HEAD WITHOUT AND WITH CONTRAST  MRI CERVICAL SPINE WITHOUT AND WITH CONTRAST  MRI THORACIC SPINE WITHOUT AND WITH CONTRAST  TECHNIQUE: Multiplanar, multiecho pulse sequences of the brain and surrounding structures, cervical and thoracic spine, to include the craniocervical junction, were obtained without and with intravenous contrast.  CONTRAST:  72m MULTIHANCE GADOBENATE DIMEGLUMINE 529 MG/ML IV SOLN chest radiograph November 19, 2014 and lumbar spine radiographs November 19, 2014  COMPARISON:  None.  FINDINGS: MRI HEAD FINDINGS  The ventricles and sulci are normal for patient's age. No abnormal parenchymal signal, mass lesions, mass effect. No abnormal parenchymal enhancement. No reduced diffusion to suggest acute ischemia. No susceptibility artifact to suggest hemorrhage.  No abnormal extra-axial fluid collections.  No extra-axial masses nor leptomeningeal enhancement. Normal major intracranial vascular flow voids seen at the skull base.  Ocular globes and orbital contents are unremarkable though not tailored for evaluation. No abnormal sellar expansion. Visualized paranasal sinuses and mastoid air cells are well-aerated. No suspicious calvarial bone marrow signal. No abnormal sellar expansion. Craniocervical junction maintained.  MRI CERVICAL SPINE FINDINGS  Expansile cervical spinal cord T2 bright, T1 hypointense lesion with homogeneous enhancement from C4 through C6, measuring 4.1 cm in craniocaudad dimension, with relative sparing of the lateral columns. No additional cervical spinal cord lesions identified. No syrinx. No abnormal leptomeningeal nor epidural enhancement.  Cervical vertebral bodies intact, straightened cervical lordosis. 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 Intervertebral discs demonstrate generally normal morphology, slightly decreased T2 signal consistent with desiccation. Mild subacute discogenic endplate changes CD4-0 CC1-4and a lesser extent at C6-7 without STIR signal abnormality to suggest acute osseous process. No abnormal osseous or intradiscal enhancement.  2.3 x 1.9 cm dominant LEFT thyroid nodule.  Level by level evaluation:  C2-3:  No disc bulge, canal stenosis or neural foraminal narrowing.  C3-4: 2 mm broad-based disc bulge, uncovertebral hypertrophy and mild LEFT facet arthropathy. Mild canal stenosis, moderate to severe LEFT neural foraminal narrowing.  C4-5: 1-2 mm broad-based disc bulge, uncovertebral hypertrophy and mild facet arthropathy. Mild canal stenosis, mild neural foraminal narrowing.  C5-6: 1-2 mm broad-based disc bulge, uncovertebral hypertrophy. Mild canal stenosis. Mild to moderate LEFT neural foraminal narrowing.  C6-7: Annular bulging, uncovertebral hypertrophy without canal stenosis or neural foraminal narrowing.  C7-T1: No  disc bulge, canal stenosis nor  neural foraminal narrowing.  MRI THORACIC SPINE FINDINGS  Thoracic vertebral bodies and posterior elements intact aligned 1 maintenance of thoracic kyphosis. Mild T7-8 disc height loss, with decreased T2 signal suggesting mild desiccation. Mild acute enhancing discogenic endplate change at N8-2, subacute discogenic endplate changes N5-6. No STIR signal abnormality to suggest fracture. No abnormal osseous or intradiscal enhancement.  Thoracic spinal cord appears normal morphology and signal characteristics the conus medullaris which terminates at T12-L1. No abnormal cord, leptomeningeal or epidural enhancement. Prevertebral and paraspinal soft tissues are normal.  Tiny T5-6 central disc protrusion. 2 mm RIGHT T7 suspected disc extrusion partially characterized due to slice thickness. No canal stenosis or neural foraminal narrowing at any thoracic level.  IMPRESSION: MRI HEAD  Normal MRI of the brain without without contrast.  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 Neural foraminal narrowing C3-4 through C5-6: Moderate to severe on the LEFT at C3-4.  2.3 cm LEFT thyroid nodule for which follow up thyroid sonogram is recommended on a nonemergent basis.  MRI THORACIC SPINE  No MR findings of demyelination in the thoracic spinal cord.  Mild degenerative change of thoracic spine without canal stenosis or neural foraminal narrowing.   Electronically Signed   By: Elon Alas   On: 12/04/2014 04:49   Dg Fluoro Guide Ndl Plc/bx  12/05/2014   CLINICAL DATA:  Peripheral neuropathy  EXAM: DIAGNOSTIC LUMBAR PUNCTURE UNDER FLUOROSCOPIC GUIDANCE  FLUOROSCOPY TIME:  30 seconds.  PROCEDURE: Informed consent was obtained from the patient prior to the procedure, including potential complications of  headache, allergy, and pain. With the patient prone, the lower back was prepped with Betadine. 1% Lidocaine was used for local anesthesia. Lumbar puncture was performed at the left L2-3 level using a 20 gauge needle with return of clear CSF with an opening pressure of 20 cm water. Tenml of CSF were obtained for laboratory studies. The patient tolerated the procedure well and there were no apparent complications.  COMPLICATIONS: None  IMPRESSION: Successful lumbar puncture for fluid.  Opening depression 20 cm water.  10 cc clear CSF obtained.   Electronically Signed   By: Maryclare Bean M.D.   On: 12/05/2014 08:11    Medications:  I have reviewed the patient's current medications. Scheduled: . heparin subcutaneous  5,000 Units Subcutaneous 3 times per day  . methylPREDNISolone (SOLU-MEDROL) injection  1,000 mg Intravenous Daily  . nystatin   Topical BID  . pantoprazole (PROTONIX) IV  40 mg Intravenous Q24H  . Vitamin D (Ergocalciferol)  50,000 Units Oral Q7 days    Assessment/Plan: Patient s/p LP.  Symptoms continue-no worsening.  LP shows normal protein and glucose.  7 white cells.  Gram stain negative.  ESR 27.  CRP 0.8.  Vitamin D low at 4.3.  TSH and vitamin B12 normal.  Remaining lab work pending.  Infectious etiology unlikely.  Patient started on Solumedrol with first dose yesterday.    Recommendations: 1.  Continue Solumedrol at 1059m daily for 5 days in total.   2.  Vitamin D supplementation 3.  Will continue to follow up lab work  Case discussed with Dr. [facility]  LOS: 2 days   LAlexis Goodell MD [facility] [phone]/23/2016  12:13 PM

## 2014-12-05 NOTE — Progress Notes (Addendum)
Patient Demographics  Jessica Cisneros, is a 39 y.o. female, DOB - 05-21-1976, HQI:[id number]  Admit date - 12/03/2014   Admitting Physician Houston Siren, MD  Outpatient Primary MD for the patient is Pcp Not In System  LOS - 2   Chief Complaint  Patient presents with  . Numbness       Admission history of present illness/brief narrative:  Jessica Cisneros is an 39 y.o. female  presents to the ER complaining of feeling numbness and tingling starting on her hip and progressing cephalad and now on her chest. She denied weakness, pain, blurry vision, stiffneck, fever, or chills. She has some HA, but no stiffneck. She denied any rash.  Evaluation in the ER showed normal serology. Neurology was consulted, recommended hospitalist to admit for further work up to include imaging. Her mother has Multiple Sclerosis. She has no weakness nor pain. 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  Subjective:   Jessica Cisneros today has, No headache, No chest pain, No abdominal pain - No Nausea, No new weakness tingling or numbness, No Cough - SOB.   Assessment & Plan    Active Problems:   Paresthesia   PCOS (polycystic ovarian syndrome)   Obesity  Paresthesia,  - MRI showing evidence of acute demyelination in C4-C6 . - Neurology on board,  -LP showing normal protein level and normal cell count. - Started on IV Solu Medrol 1 g daily , to finish total of 5 days , today is day #2 ,by in neurology for possible autoimmune disorders ( transverse myelitis?) - TSH within normal limits, follow on RPR, Lyme titer, B-1,  AMA, antiphospholipid, HIV - Low vitamin D level, will start on vitamin D supplement. - PT/OT consult, out of bed to chair.  Low vitamin D - cont with supplement.     Code Status: Full  Family Communication: Family at bedside  Disposition Plan: Mains inpatient   Procedures  LP 1/22   Consults   Neurology   Medications  Scheduled Meds: . heparin subcutaneous  5,000 Units Subcutaneous 3 times per day  . methylPREDNISolone (SOLU-MEDROL) injection  1,000 mg Intravenous Daily  . nystatin   Topical BID  . pantoprazole (PROTONIX) IV  40 mg Intravenous Q24H  . Vitamin D (Ergocalciferol)  50,000 Units Oral Q7 days   Continuous Infusions:  PRN Meds:.  DVT Prophylaxis  subcutaneous heparin  Lab Results  Component Value Date   PLT 407* 12/05/2014    Antibiotics    Anti-infectives    None          Objective:   Filed Vitals:   12/04/14 1707 12/04/14 2225 12/05/14 0212 12/05/14 0558  BP: 118/50 127/73 123/71 158/66  Pulse: 84 87 103 90  Temp: 98.8 F (37.1 C) 98.7 F (37.1 C) 98.4 F (36.9 C) 98.4 F (36.9 C)  TempSrc: Oral Oral Oral Oral  Resp: 20 20 18 18   Height:      Weight:  SpO2: 97% 98% 96% 94%    Wt Readings from Last 3 Encounters:  12/03/14 136.079 kg (300 lb)    No intake or output data in the 24 hours ending 12/05/14 1107   Physical Exam  Awake Alert, Oriented X 3, No new F.N deficits, Normal affect Jessica Cisneros,PERRAL Supple Neck,No JVD, No cervical lymphadenopathy appriciated.  Symmetrical Chest wall movement, Good air movement bilaterally, CTAB RRR,No Gallops,Rubs or new Murmurs, No Parasternal Heave +ve B.Sounds, Abd Soft, No tenderness, No organomegaly appriciated, No rebound - guarding or rigidity. No Cyanosis, Clubbing or edema, No new Rash or bruise     Data Review   Micro Results Recent Results (from the past 240 hour(s))  Gram stain     Status: None   Collection Time: 12/04/14 12:32 PM  Result Value Ref Range Status    Specimen Description CSF  Final   Special Requests NONE  Final   Gram Stain   Final    WBC PRESENT, PREDOMINANTLY MONONUCLEAR NO ORGANISMS SEEN CYTOSPIN SLIDE    Report Status 12/04/2014 FINAL  Final    Radiology Reports Mr Laqueta Jean Wo Contrast  12/04/2014   CLINICAL DATA:  Numbness and tingling in hips progressing to chest bilaterally over 3 weeks.  EXAM: MRI HEAD WITHOUT AND WITH CONTRAST  MRI CERVICAL SPINE WITHOUT AND WITH CONTRAST  MRI THORACIC SPINE WITHOUT AND WITH CONTRAST  TECHNIQUE: Multiplanar, multiecho pulse sequences of the brain and surrounding structures, cervical and thoracic spine, to include the craniocervical junction, were obtained without and with intravenous contrast.  CONTRAST:  20mL MULTIHANCE GADOBENATE DIMEGLUMINE 529 MG/ML IV SOLN chest radiograph November 19, 2014 and lumbar spine radiographs November 19, 2014  COMPARISON:  None.  FINDINGS: MRI HEAD FINDINGS  The ventricles and sulci are normal for patient's age. No abnormal parenchymal signal, mass lesions, mass effect. No abnormal parenchymal enhancement. No reduced diffusion to suggest acute ischemia. No susceptibility artifact to suggest hemorrhage.  No abnormal extra-axial fluid collections. No extra-axial masses nor leptomeningeal enhancement. Normal major intracranial vascular flow voids seen at the skull base.  Ocular globes and orbital contents are unremarkable though not tailored for evaluation. No abnormal sellar expansion. Visualized paranasal sinuses and mastoid air cells are well-aerated. No suspicious calvarial bone marrow signal. No abnormal sellar expansion. Craniocervical junction maintained.  MRI CERVICAL SPINE FINDINGS  Expansile cervical spinal cord T2 bright, T1 hypointense lesion with homogeneous enhancement from C4 through C6, measuring 4.1 cm in craniocaudad dimension, with relative sparing of the lateral columns. No additional cervical spinal cord lesions identified. No syrinx. No abnormal leptomeningeal  nor epidural enhancement.  Cervical vertebral bodies intact, straightened cervical lordosis. 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 Intervertebral discs demonstrate generally normal morphology, slightly decreased T2 signal consistent with desiccation. Mild subacute discogenic endplate changes C3-4, C5-6 and a lesser extent at C6-7 without STIR signal abnormality to suggest acute osseous process. No abnormal osseous or intradiscal enhancement.  2.3 x 1.9 cm dominant LEFT thyroid nodule.  Level by level evaluation:  C2-3:  No disc bulge, canal stenosis or neural foraminal narrowing.  C3-4: 2 mm broad-based disc bulge, uncovertebral hypertrophy and mild LEFT facet arthropathy. Mild canal stenosis, moderate to severe LEFT neural foraminal narrowing.  C4-5: 1-2 mm broad-based disc bulge, uncovertebral hypertrophy and mild facet arthropathy. Mild canal stenosis, mild neural foraminal narrowing.  C5-6: 1-2 mm broad-based disc bulge, uncovertebral hypertrophy. Mild canal stenosis. Mild to moderate LEFT neural foraminal narrowing.  C6-7: Annular bulging, uncovertebral hypertrophy without canal stenosis or  neural foraminal narrowing.  C7-T1: No disc bulge, canal stenosis nor neural foraminal narrowing.  MRI THORACIC SPINE FINDINGS  Thoracic vertebral bodies and posterior elements intact aligned 1 maintenance of thoracic kyphosis. Mild T7-8 disc height loss, with decreased T2 signal suggesting mild desiccation. Mild acute enhancing discogenic endplate change at T5-6, subacute discogenic endplate changes T7-8. No STIR signal abnormality to suggest fracture. No abnormal osseous or intradiscal enhancement.  Thoracic spinal cord appears normal morphology and signal characteristics the conus medullaris which terminates at T12-L1. No abnormal cord, leptomeningeal or epidural enhancement. Prevertebral and paraspinal soft tissues are normal.  Tiny T5-6 central disc protrusion. 2 mm RIGHT T7 suspected disc extrusion  partially characterized due to slice thickness. No canal stenosis or neural foraminal narrowing at any thoracic level.  IMPRESSION: MRI HEAD  Normal MRI of the brain without without contrast.  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 Neural foraminal narrowing C3-4 through C5-6: Moderate to severe on the LEFT at C3-4.  2.3 cm LEFT thyroid nodule for which follow up thyroid sonogram is recommended on a nonemergent basis.  MRI THORACIC SPINE  No MR findings of demyelination in the thoracic spinal cord.  Mild degenerative change of thoracic spine without canal stenosis or neural foraminal narrowing.   Electronically Signed   By: Awilda Metro   On: 12/04/2014 04:49   Mr Cervical Spine W Wo Contrast  12/04/2014   CLINICAL DATA:  Numbness and tingling in hips progressing to chest bilaterally over 3 weeks.  EXAM: MRI HEAD WITHOUT AND WITH CONTRAST  MRI CERVICAL SPINE WITHOUT AND WITH CONTRAST  MRI THORACIC SPINE WITHOUT AND WITH CONTRAST  TECHNIQUE: Multiplanar, multiecho pulse sequences of the brain and surrounding structures, cervical and thoracic spine, to include the craniocervical junction, were obtained without and with intravenous contrast.  CONTRAST:  20mL MULTIHANCE GADOBENATE DIMEGLUMINE 529 MG/ML IV SOLN chest radiograph November 19, 2014 and lumbar spine radiographs November 19, 2014  COMPARISON:  None.  FINDINGS: MRI HEAD FINDINGS  The ventricles and sulci are normal for patient's age. No abnormal parenchymal signal, mass lesions, mass effect. No abnormal parenchymal enhancement. No reduced diffusion to suggest acute ischemia. No susceptibility artifact to suggest hemorrhage.  No abnormal extra-axial fluid collections. No extra-axial masses nor leptomeningeal enhancement. Normal  major intracranial vascular flow voids seen at the skull base.  Ocular globes and orbital contents are unremarkable though not tailored for evaluation. No abnormal sellar expansion. Visualized paranasal sinuses and mastoid air cells are well-aerated. No suspicious calvarial bone marrow signal. No abnormal sellar expansion. Craniocervical junction maintained.  MRI CERVICAL SPINE FINDINGS  Expansile cervical spinal cord T2 bright, T1 hypointense lesion with homogeneous enhancement from C4 through C6, measuring 4.1 cm in craniocaudad dimension, with relative sparing of the lateral columns. No additional cervical spinal cord lesions identified. No syrinx. No abnormal leptomeningeal nor epidural enhancement.  Cervical vertebral bodies intact, straightened cervical lordosis. 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 Intervertebral discs demonstrate generally normal morphology, slightly decreased T2 signal consistent with desiccation. Mild subacute discogenic endplate changes C3-4, C5-6 and a lesser extent at C6-7 without STIR signal abnormality to suggest acute osseous process. No abnormal osseous or intradiscal enhancement.  2.3 x 1.9 cm dominant LEFT thyroid nodule.  Level by level evaluation:  C2-3:  No disc bulge, canal stenosis or neural  foraminal narrowing.  C3-4: 2 mm broad-based disc bulge, uncovertebral hypertrophy and mild LEFT facet arthropathy. Mild canal stenosis, moderate to severe LEFT neural foraminal narrowing.  C4-5: 1-2 mm broad-based disc bulge, uncovertebral hypertrophy and mild facet arthropathy. Mild canal stenosis, mild neural foraminal narrowing.  C5-6: 1-2 mm broad-based disc bulge, uncovertebral hypertrophy. Mild canal stenosis. Mild to moderate LEFT neural foraminal narrowing.  C6-7: Annular bulging, uncovertebral hypertrophy without canal stenosis or neural foraminal narrowing.  C7-T1: No disc bulge, canal stenosis nor neural foraminal narrowing.  MRI THORACIC SPINE FINDINGS   Thoracic vertebral bodies and posterior elements intact aligned 1 maintenance of thoracic kyphosis. Mild T7-8 disc height loss, with decreased T2 signal suggesting mild desiccation. Mild acute enhancing discogenic endplate change at T5-6, subacute discogenic endplate changes T7-8. No STIR signal abnormality to suggest fracture. No abnormal osseous or intradiscal enhancement.  Thoracic spinal cord appears normal morphology and signal characteristics the conus medullaris which terminates at T12-L1. No abnormal cord, leptomeningeal or epidural enhancement. Prevertebral and paraspinal soft tissues are normal.  Tiny T5-6 central disc protrusion. 2 mm RIGHT T7 suspected disc extrusion partially characterized due to slice thickness. No canal stenosis or neural foraminal narrowing at any thoracic level.  IMPRESSION: MRI HEAD  Normal MRI of the brain without without contrast.  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 Neural foraminal narrowing C3-4 through C5-6: Moderate to severe on the LEFT at C3-4.  2.3 cm LEFT thyroid nodule for which follow up thyroid sonogram is recommended on a nonemergent basis.  MRI THORACIC SPINE  No MR findings of demyelination in the thoracic spinal cord.  Mild degenerative change of thoracic spine without canal stenosis or neural foraminal narrowing.   Electronically Signed   By: Awilda Metro   On: 12/04/2014 04:49   Mr Thoracic Spine W Wo Contrast  12/04/2014   CLINICAL DATA:  Numbness and tingling in hips progressing to chest bilaterally over 3 weeks.  EXAM: MRI HEAD WITHOUT AND WITH CONTRAST  MRI CERVICAL SPINE WITHOUT AND WITH CONTRAST  MRI THORACIC SPINE WITHOUT AND WITH CONTRAST  TECHNIQUE: Multiplanar, multiecho pulse sequences of the brain and surrounding  structures, cervical and thoracic spine, to include the craniocervical junction, were obtained without and with intravenous contrast.  CONTRAST:  20mL MULTIHANCE GADOBENATE DIMEGLUMINE 529 MG/ML IV SOLN chest radiograph November 19, 2014 and lumbar spine radiographs November 19, 2014  COMPARISON:  None.  FINDINGS: MRI HEAD FINDINGS  The ventricles and sulci are normal for patient's age. No abnormal parenchymal signal, mass lesions, mass effect. No abnormal parenchymal enhancement. No reduced diffusion to suggest acute ischemia. No susceptibility artifact to suggest hemorrhage.  No abnormal extra-axial fluid collections. No extra-axial masses nor leptomeningeal enhancement. Normal major intracranial vascular flow voids seen at the skull base.  Ocular globes and orbital contents are unremarkable though not tailored for evaluation. No abnormal sellar expansion. Visualized paranasal sinuses and mastoid air cells are well-aerated. No suspicious calvarial bone marrow signal. No abnormal sellar expansion. Craniocervical junction maintained.  MRI CERVICAL SPINE FINDINGS  Expansile cervical spinal cord T2 bright, T1 hypointense lesion with homogeneous enhancement from C4 through C6, measuring 4.1 cm in craniocaudad dimension, with relative sparing of the lateral columns. No additional cervical spinal cord lesions identified. No syrinx. No abnormal leptomeningeal nor epidural enhancement.  Cervical vertebral bodies intact,  straightened cervical lordosis. 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 Intervertebral discs demonstrate generally normal morphology, slightly decreased T2 signal consistent with desiccation. Mild subacute discogenic endplate changes C3-4, C5-6 and a lesser extent at C6-7 without STIR signal abnormality to suggest acute osseous process. No abnormal osseous or intradiscal enhancement.  2.3 x 1.9 cm dominant LEFT thyroid nodule.  Level by level evaluation:  C2-3:  No disc bulge, canal stenosis or  neural foraminal narrowing.  C3-4: 2 mm broad-based disc bulge, uncovertebral hypertrophy and mild LEFT facet arthropathy. Mild canal stenosis, moderate to severe LEFT neural foraminal narrowing.  C4-5: 1-2 mm broad-based disc bulge, uncovertebral hypertrophy and mild facet arthropathy. Mild canal stenosis, mild neural foraminal narrowing.  C5-6: 1-2 mm broad-based disc bulge, uncovertebral hypertrophy. Mild canal stenosis. Mild to moderate LEFT neural foraminal narrowing.  C6-7: Annular bulging, uncovertebral hypertrophy without canal stenosis or neural foraminal narrowing.  C7-T1: No disc bulge, canal stenosis nor neural foraminal narrowing.  MRI THORACIC SPINE FINDINGS  Thoracic vertebral bodies and posterior elements intact aligned 1 maintenance of thoracic kyphosis. Mild T7-8 disc height loss, with decreased T2 signal suggesting mild desiccation. Mild acute enhancing discogenic endplate change at T5-6, subacute discogenic endplate changes T7-8. No STIR signal abnormality to suggest fracture. No abnormal osseous or intradiscal enhancement.  Thoracic spinal cord appears normal morphology and signal characteristics the conus medullaris which terminates at T12-L1. No abnormal cord, leptomeningeal or epidural enhancement. Prevertebral and paraspinal soft tissues are normal.  Tiny T5-6 central disc protrusion. 2 mm RIGHT T7 suspected disc extrusion partially characterized due to slice thickness. No canal stenosis or neural foraminal narrowing at any thoracic level.  IMPRESSION: MRI HEAD  Normal MRI of the brain without without contrast.  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 Neural foraminal narrowing C3-4 through C5-6: Moderate to severe on the LEFT at C3-4.  2.3 cm LEFT thyroid nodule for  which follow up thyroid sonogram is recommended on a nonemergent basis.  MRI THORACIC SPINE  No MR findings of demyelination in the thoracic spinal cord.  Mild degenerative change of thoracic spine without canal stenosis or neural foraminal narrowing.   Electronically Signed   By: Awilda Metro   On: 12/04/2014 04:49   Dg Fluoro Guide Ndl Plc/bx  12/05/2014   CLINICAL DATA:  Peripheral neuropathy  EXAM: DIAGNOSTIC LUMBAR PUNCTURE UNDER FLUOROSCOPIC GUIDANCE  FLUOROSCOPY TIME:  30 seconds.  PROCEDURE: Informed consent was obtained from the patient prior to the procedure, including potential complications of headache, allergy, and pain. With the patient prone, the lower back was prepped with Betadine. 1% Lidocaine was used for local anesthesia. Lumbar puncture was performed at the left L2-3 level using a 20 gauge needle with return of clear CSF with an opening pressure of 20 cm water. Tenml of CSF were obtained for laboratory studies. The patient tolerated the procedure well and there were no apparent complications.  COMPLICATIONS: None  IMPRESSION: Successful lumbar puncture for fluid.  Opening depression 20 cm water.  10 cc clear CSF obtained.   Electronically Signed   By: Maryclare Bean M.D.   On: 12/05/2014 08:11    CBC  Recent Labs Lab 12/03/14 2133 12/04/14 0720 12/05/14 0446  WBC 9.2 9.0 9.6  HGB 10.4* 10.3* 11.3*  HCT 33.7* 33.1* 36.4  PLT 386 345 407*  MCV 77.3* 77.0* 77.0*  MCH 23.9* 24.0* 23.9*  MCHC 30.9 31.1 31.0  RDW 15.3 15.3 15.3  LYMPHSABS 2.3  --   --   MONOABS 0.6  --   --   EOSABS 0.1  --   --   BASOSABS 0.0  --   --     Chemistries   Recent Labs Lab 12/03/14 2133 12/04/14 0720 12/05/14 0446  NA 139  --  138  K 4.0  --  4.0  CL 104  --  107  CO2 28  --  23  GLUCOSE 137*  --  185*  BUN 8  --  8  CREATININE 0.85 0.81 0.80  CALCIUM 9.1  --  9.2  AST 22  --   --   ALT 23  --   --   ALKPHOS 91  --   --   BILITOT 0.7  --   --     ------------------------------------------------------------------------------------------------------------------ estimated creatinine clearance is 130.1 mL/min (by C-G formula based on Cr of 0.8). ------------------------------------------------------------------------------------------------------------------ No results for input(s): HGBA1C in the last 72 hours. ------------------------------------------------------------------------------------------------------------------ No results for input(s): CHOL, HDL, LDLCALC, TRIG, CHOLHDL, LDLDIRECT in the last 72 hours. ------------------------------------------------------------------------------------------------------------------  Recent Labs  12/04/14 0720  TSH 2.041   ------------------------------------------------------------------------------------------------------------------  Recent Labs  12/04/14 0720  VITAMINB12 373    Coagulation profile No results for input(s): INR, PROTIME in the last 168 hours.  No results for input(s): DDIMER in the last 72 hours.  Cardiac Enzymes No results for input(s): CKMB, TROPONINI, MYOGLOBIN in the last 168 hours.  Invalid input(s): CK ------------------------------------------------------------------------------------------------------------------ Invalid input(s): POCBNP     Time Spent in minutes   25 minutes   Rachele Lamaster M.D on 12/05/2014 at 11:07 AM  Between 7am to 7pm - Pager - [phone]  After 7pm go to [email or web address] - password [email or web address]  And look for the night coverage person covering for me after hours  [facility] Office  [phone]   **Disclaimer: This note may have been dictated with voice recognition software. Similar sounding words can inadvertently be transcribed and this note may contain transcription errors which may not have been corrected upon publication of note.**

## 2014-12-05 NOTE — Progress Notes (Signed)
UR completed 

## 2014-12-06 DIAGNOSIS — G0489 Other myelitis: Secondary | ICD-10-CM

## 2014-12-06 DIAGNOSIS — R7989 Other specified abnormal findings of blood chemistry: Secondary | ICD-10-CM

## 2014-12-06 DIAGNOSIS — G373 Acute transverse myelitis in demyelinating disease of central nervous system: Secondary | ICD-10-CM | POA: Insufficient documentation

## 2014-12-06 LAB — BASIC METABOLIC PANEL
ANION GAP: 9 (ref 5–15)
BUN: 11 mg/dL (ref 6–23)
CO2: 23 mmol/L (ref 19–32)
CREATININE: 0.73 mg/dL (ref 0.50–1.10)
Calcium: 9.2 mg/dL (ref 8.4–10.5)
Chloride: 106 mmol/L (ref 96–112)
GFR calc Af Amer: >90 mL/min (ref >90)
Glucose, Bld: 170 mg/dL — ABNORMAL HIGH (ref 70–99)
POTASSIUM: 4.2 mmol/L (ref 3.5–5.1)
Sodium: 138 mmol/L (ref 135–145)

## 2014-12-06 LAB — URINALYSIS, ROUTINE W REFLEX MICROSCOPIC
Bilirubin Urine: NEGATIVE
Hgb urine dipstick: NEGATIVE
KETONES UR: 15 mg/dL — AB
Leukocytes, UA: NEGATIVE
Nitrite: NEGATIVE
PH: 6 (ref 5.0–8.0)
Protein, ur: NEGATIVE mg/dL
Specific Gravity, Urine: 1.037 — ABNORMAL HIGH (ref 1.005–1.030)
Urobilinogen, UA: 0.2 mg/dL (ref 0.0–1.0)

## 2014-12-06 LAB — CBC
HCT: 33.7 % — ABNORMAL LOW (ref 36.0–46.0)
Hemoglobin: 10.3 g/dL — ABNORMAL LOW (ref 12.0–15.0)
MCH: 23.8 pg — ABNORMAL LOW (ref 26.0–34.0)
MCHC: 30.6 g/dL (ref 30.0–36.0)
MCV: 77.8 fL — ABNORMAL LOW (ref 78.0–100.0)
PLATELETS: 415 10*3/uL — AB (ref 150–400)
RBC: 4.33 MIL/uL (ref 3.87–5.11)
RDW: 15.4 % (ref 11.5–15.5)
WBC: 18.8 10*3/uL — ABNORMAL HIGH (ref 4.0–10.5)

## 2014-12-06 LAB — B. BURGDORFI ANTIBODIES: B burgdorferi Ab IgG+IgM: <0.91 {ISR} (ref 0.00–0.90)

## 2014-12-06 LAB — URINE MICROSCOPIC-ADD ON

## 2014-12-06 LAB — HIV ANTIBODY (ROUTINE TESTING W REFLEX)
HIV 1/O/2 Abs-Index Value: <1 (ref <1.00)
HIV-1/HIV-2 Ab: NONREACTIVE

## 2014-12-06 LAB — RPR: RPR Ser Ql: NONREACTIVE

## 2014-12-06 MED ORDER — POLYETHYLENE GLYCOL 3350 17 G PO PACK: 17.0000 g | PACK | Freq: Every day | ORAL | Status: DC | PRN

## 2014-12-06 MED ORDER — PANTOPRAZOLE SODIUM 40 MG PO TBEC
40.0000 mg | DELAYED_RELEASE_TABLET | Freq: Every day | ORAL | Status: DC
  Administered 2014-12-06 – 2014-12-12 (×7): 40 mg via ORAL
  Filled 2014-12-06 (×7): qty 1

## 2014-12-06 NOTE — Evaluation (Signed)
Occupational Therapy Evaluation Patient Details Name: Jessica Cisneros MRN: [id number] DOB: 08-May-1976 Today's Date: 12/06/2014    History of Present Illness Pt is a 39 y.o. Female admitted 12/03/14 with ascending parasthesia with c/o numbness and tingling for 3 weeks of her LEs, torso up to chest, and hands. 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 Pt currently receiving solumedrol treatments.    Clinical Impression   PTA pt lived at home and was independent with ADLs. Pt currently limited by decreased sensation in LEs, hands, and torse (up to chest) and requires Supervision for safety with ADLs. Pt expressed concern about home accessibility and safety and provided pt with resource for [facility] and encouraged pt to make self-referral. May need to consider SNF placement if home is unsafe for pt to d/c to. Pt will benefit from acute OT for further ADL training given decreased sensation.     Follow Up Recommendations  No OT follow up;Supervision/Assistance - 24 hour    Equipment Recommendations  None recommended by OT    Recommendations for Other Services       Precautions / Restrictions Precautions Precautions: Fall Restrictions Weight Bearing Restrictions: No      Mobility Bed Mobility Overal bed mobility: Needs Assistance Bed Mobility: Supine to Sit;Sit to Supine     Supine to sit: Min assist Sit to supine: Min assist   General bed mobility comments: hand held assist from significant other  Transfers Overall transfer level: Needs assistance Equipment used: Rolling walker (2 wheeled);None Transfers: Sit to/from Stand Sit to Stand: Supervision         General transfer  comment: Supervision for safety due to decreased sensation.          ADL Overall ADL's : Needs assistance/impaired                                       General ADL Comments: Pt overall requires Supervision for safety with functional mobility and ADLs due to impaired sensation. Discussed at length preventative measures to take with ADLs to protect skin. Advised that pt not attempt to get into or out of tub at this time due to fall risk and encouraged thorough sponge bathing. Explained importance of skin checks on fingers and feet and care of wounds to decrease risk of infection. Pt tearful at times and provided therapeutic listening and support. Pt reports structural issues with home and accessibility issues. Provided pt with information for [facility] and encouraged pt to make a self-referral.      Vision  Pt reports no change from baseline.                    Perception Perception Perception Tested?: No   Praxis Praxis Praxis tested?: Within functional limits    Pertinent Vitals/Pain Pain Assessment: No/denies pain     Hand Dominance Right   Extremity/Trunk Assessment Upper Extremity Assessment Upper Extremity Assessment: Generalized weakness;RUE deficits/detail;LUE deficits/detail (pt with some flexion compensation for grip strength. R>L) RUE Sensation: decreased light touch LUE Sensation: decreased light touch   Lower Extremity Assessment Lower Extremity Assessment: Overall [facility]  for tasks assessed   Cervical / Trunk Assessment Cervical / Trunk Assessment: Other exceptions Cervical / Trunk Exceptions: morbidly obese   Communication Communication Communication: No difficulties   Cognition Arousal/Alertness: Awake/alert Behavior During Therapy: WFL for tasks assessed/performed (tearful at times) Overall Cognitive Status: Within Functional Limits for tasks assessed                     General Comments    describes  pins and needles, gloved sensation in hands/feet and improving a little after 3 doses steroids   Exercises Exercises: Other exercises Other Exercises Other Exercises: encouraged pt to continue moving UEs and gripping items for functional use. Encouraged pt to hold hands naturally and to do as much for herself as possible.         Home Living Family/patient expects to be discharged to:: Private residence Living Arrangements: Spouse/significant other;Children (62 and 70 year old daughters) Available Help at Discharge: Family;Available 24 hours/day Type of Home: House Home Access: Stairs to enter [employer] of Steps: 3 (1 foot drop from front door to porch)   Home Layout: Two level;Bed/bath upstairs Alternate Level Stairs-Number of Steps: 12 Alternate Level Stairs-Rails: Left Bathroom Shower/Tub: Tub only (claw foot tub, no shower)   Bathroom Toilet: Standard     Home Equipment: None   Additional Comments: home has structural issue including water damage to floor in kitchen and only has clawfoot tub (no shower) in bathroom;  asking for help with improvements including rails and/or ramp at entry, fix kitchen floor, and help decluttering room      Prior Functioning/Environment Level of Independence: Independent             OT Diagnosis: Generalized weakness;Other (comment) (impaired sensation)   OT Problem List: Decreased coordination;Decreased safety awareness;Decreased knowledge of precautions;Decreased knowledge of use of DME or AE;Impaired sensation;Impaired UE functional use;Decreased strength   OT Treatment/Interventions: Self-care/ADL training;Therapeutic exercise;Energy conservation;DME and/or AE instruction;Therapeutic activities;Patient/family education;Balance training    OT Goals(Current goals can be found in the care plan section) Acute Rehab OT Goals Patient Stated Goal: to feel normal again OT Goal Formulation: With patient Time For Goal  Achievement: 12/21/15 Potential to Achieve Goals: Good ADL Goals Pt Will Perform Grooming: with modified independence;standing Pt Will Transfer to Toilet: with modified independence;ambulating Additional ADL Goal #1: Pt will verbalize 3 safety precautions for ADLs given decreased sensation.  OT Frequency: Min 2X/week   Barriers to D/C: Inaccessible home environment             End of Session Equipment Utilized During Treatment: Rolling walker  Activity Tolerance: Patient tolerated treatment well Patient left: in bed;with call bell/phone within reach;with family/visitor present   Time: 6237-6283 OT Time Calculation (min): 30 min Charges:  OT General Charges $OT Visit: 1 Procedure OT Evaluation $Initial OT Evaluation Tier I: 1 Procedure OT Treatments $Self Care/Home Management : 8-22 mins G-Codes:    Rae Lips 2014-12-30, 3:29 PM Carney Living, OTR/L Occupational Therapist [phone] (pager)

## 2014-12-06 NOTE — Evaluation (Addendum)
Physical Therapy Evaluation Patient Details Name: Jessica Cisneros MRN: [id number] DOB: 08/16/1976 Today's Date: 12/06/2014   History of Present Illness    39 y.o. female presents to the ER complaining of feeling numbness and tingling starting on her hip and progressing cephalad and now on her chest. 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  Pt advised to walk halls 4-5 times daily for short bouts using device for safety.  Expect pt to be able to return home following medical treatment if physical performance continues/improves, however pt has some significant barriers at home, including multiple level home, deep step height to enter home, floor damage in kitchen, flight to second story and inconsistent assist from husband.  Pt has 10 and 83 yo daughters who are able to help as appropriate.  Also, as Medicaid patient, it is UNLIKELY that pt will receive services in OP or HH due to poor reimbursement.    Recommendations:     1.  Pt is likely appropriate for HHPT however with poor reimbursement it is unlikely any agency will agree to provide care.  MAY need to consider shortterm SNF at d/c if PT still indicated.      2.  Increase daily activity, walk hallways multiple short bouts, use RW for safety unless with PT/OT    3.  Consult CSW to assist with community resource identification (and placement if deemed necessary) to help with possible ramp construction and floor repair    4.   OT to assess for ADL and home needs  Will  initiate PT in acute setting, see daily, and reassess d/c recommendations as appropriate.  Thanks,     Follow Up Recommendations Home health PT;Supervision - Intermittent    Equipment Recommendations  Rolling walker with 5" wheels (bariatric RW please; tub transfer bench??)    Recommendations for Other Services OT consult (CSW consult)     Precautions / Restrictions Precautions Precautions: Fall Precaution Comments: supervision when up, use RW for safety with non-therapy staff Restrictions Weight Bearing Restrictions: No      Mobility  Bed Mobility Overal bed mobility:  (unobserved, up in chair)                Transfers Overall transfer level: Needs assistance Equipment used: Rolling walker (2 wheeled);None Transfers: Sit to/from Stand Sit to Stand: Supervision         General transfer comment: pt able to accomplish safely without hesitation or physical assist, minimal cues for safest transition and especially to slow descent eccentrically; supervision for safety as sensory deficits continue to improve  Ambulation/Gait Ambulation/Gait assistance: Supervision Ambulation Distance (Feet): 250 Feet (total with stair training at beginning) Assistive device: Rolling walker (2 wheeled);None Gait Pattern/deviations: Step-through pattern;Decreased stride length;Wide base of support     General Gait Details: pt minimally dependent on RW, able to ambulate safely without device >100+ feet while supervised by PT; instruction to increase speed and spontaneous armswing  Stairs Stairs: Yes Stairs assistance: Supervision Stair Management: Two rails;One rail Left;No rails;Forwards Number of Stairs: 5 General stair comments: up and over practice  stairs (3 short, 2 tall) with bilat rails, reciprocal pattern up, step to down by perference; up and over with left rail using step to pattern and less confidence, and able to step up/down 3 without rails with min guard for safety noting  decreased confidence and increased instability  Wheelchair Mobility    Modified Rankin (Stroke Patients Only)       Balance Overall balance assessment: Needs assistance   Sitting balance-Leahy Scale: Fair     Standing balance support: No upper extremity supported Standing balance-Leahy Scale: Fair Standing balance comment: instructed on 2 balance exercises: rhomberg eyes open and eyes closed; single leg stance each leg --> explictly instructed to perform each for 10-15 sec while next to sturdy countertop for safety Single Leg Stance - Right Leg: 10 Single Leg Stance - Left Leg: 10     Rhomberg - Eyes Opened: 15 Rhomberg - Eyes Closed: 15                 Pertinent Vitals/Pain Pain Assessment: No/denies pain    Home Living Family/patient expects to be discharged to:: Private residence Living Arrangements: Spouse/significant other Available Help at Discharge: Family;Available 24 hours/day Type of Home: House Home Access: Stairs to enter Entrance Stairs-Rails: None Entrance Stairs-Number of Steps: 3 (odd sized step > 2 to porch then high threshold, no rails) Home Layout: Two level;Able to live on main level with bedroom/bathroom (children on 2nd story) Home Equipment: None Additional Comments: home has structural issue including water damage to floor in kitchen and only has clawfoot tub (no shower) in bathroom;  asking for help with improvements including rails and/or ramp at entry, fix kitchen floor, and help decluttering room    Prior Function Level of Independence: Independent               Hand Dominance        Extremity/Trunk Assessment   Upper Extremity Assessment: Generalized weakness;Defer to OT evaluation (strong grip with flexion compensation Right>Left; numbness)           Lower Extremity Assessment: Overall WFL for tasks assessed (pins and needles, improving slighty after 2 doses steroids)      Cervical / Trunk Assessment:  (morbidly  obese)  Communication   Communication: No difficulties  Cognition Arousal/Alertness: Awake/alert Behavior During Therapy: WFL for tasks assessed/performed Overall Cognitive Status: Within Functional Limits for tasks assessed                      General Comments General comments (skin integrity, edema, etc.): describes pins and needles, gloved sensation in hands/feet and improving a little after 2 doses steroids;     Exercises        Assessment/Plan    PT Assessment Patient needs continued PT services  PT Diagnosis Difficulty walking   PT Problem List Obesity;Impaired sensation;Decreased knowledge of use of DME;Decreased mobility;Decreased balance  PT Treatment Interventions DME instruction;Gait training;Stair training;Functional mobility training;Therapeutic activities;Therapeutic exercise;Balance training;Patient/family education   PT Goals (Current goals can be found in the Care Plan section) Acute Rehab PT Goals Patient Stated Goal: get better PT Goal Formulation: With patient Time For Goal Achievement: 11/21/15 Potential to Achieve Goals: Good    Frequency Min 5X/week   Barriers to discharge Inaccessible home environment (concerned about stairs into and inside home) plan to ask for CSW consult for community resources to assist with structural improvements;     Co-evaluation  End of Session Equipment Utilized During Treatment: Gait belt Activity Tolerance: Patient tolerated treatment well Patient left: in chair;with call bell/phone within reach;with family/visitor present Nurse Communication: Mobility status;Precautions         Time: 1010-1102 PT Time Calculation (min) (ACUTE ONLY): 52 min   Charges:   PT Evaluation $Initial PT Evaluation Tier I: 1 Procedure PT Treatments $Gait Training: 8-22 mins $Therapeutic Activity: 23-37 mins   PT G Codes:        Dennis Bast 12/06/2014, 11:39 AM

## 2014-12-06 NOTE — Progress Notes (Signed)
Patient Demographics  Jessica Cisneros, is a 39 y.o. female, DOB - 1976/08/05, ZOX:[id number]  Admit date - 12/03/2014   Admitting Physician Houston Siren, MD  Outpatient Primary MD for the patient is Pcp Not In System  LOS - 3   Chief Complaint  Patient presents with  . Numbness       Admission history of present illness/brief narrative:  Jessica Cisneros is an 39 y.o. female  presents to the ER complaining of feeling numbness and tingling starting on her hip and progressing cephalad and now on her chest. She denied weakness, pain, blurry vision, stiffneck, fever, or chills. She has some HA, but no stiffneck. She denied any rash.  Evaluation in the ER showed normal serology. Neurology was consulted, recommended hospitalist to admit for further work up to include imaging. Her mother has Multiple Sclerosis. She has no weakness nor pain. 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 No Cough - SOB.   Assessment & Plan    Active Problems:   Paresthesia   PCOS (polycystic ovarian syndrome)   Obesity   Low serum vitamin D  Paresthesia,  - MRI showing evidence of acute demyelination in C4-C6 . - Neurology on board,  -LP showing normal protein and glucose level and normal cell count. - Started on IV Solu Medrol 1 g daily , to finish total of 5 days , today is day #3 ,by in neurology for possible autoimmune disorders ( transverse  myelitis?) - TSH within normal limits, follow on RPR, Lyme titer, B-1, AMA, antiphospholipid, HIV - TSH and B-12 within normal limits - Low vitamin D level, will start on vitamin D supplement. - PT/OT consult, out of bed to chair.  Low vitamin D - cont with supplement.  Leukocytosis - This is most likely related to her steroids     Code Status: Full  Family Communication: Family at bedside  Disposition Plan: Home once stable   Procedures  LP 1/22   Consults   Neurology   Medications  Scheduled Meds: . heparin subcutaneous  5,000 Units Subcutaneous 3 times per day  . methylPREDNISolone (SOLU-MEDROL) injection  1,000 mg Intravenous Daily  . nystatin   Topical BID  . pantoprazole (PROTONIX) IV  40 mg Intravenous Q24H  . Vitamin D (Ergocalciferol)  50,000 Units Oral Q7 days   Continuous Infusions:  PRN Meds:.  DVT Prophylaxis  subcutaneous heparin  Lab Results  Component Value Date   PLT 415* 12/06/2014    Antibiotics    Anti-infectives    None          Objective:   Filed Vitals:   12/05/14 2149 12/06/14 0100 12/06/14 0617 12/06/14 1019  BP: 145/65 134/74 148/75 128/56  Pulse: 95 84 88 82  Temp: 98.6 F (37 C) 98.2 F (36.8 C) 98.4 F (36.9 C)  98.1 F (36.7 C)  TempSrc: Oral Oral Oral Oral  Resp: 18 20 20 20   Height:      Weight:      SpO2: 96% 95% 95% 98%    Wt Readings from Last 3 Encounters:  12/03/14 136.079 kg (300 lb)    No intake or output data in the 24 hours ending 12/06/14 1039   Physical Exam  Awake Alert, Oriented X 3, No new F.N deficits, Normal affect Jessica Cisneros.AT,PERRAL Supple Neck,No JVD, No cervical lymphadenopathy appriciated.  Symmetrical Chest wall movement, Good air movement bilaterally, CTAB RRR,No Gallops,Rubs or new Murmurs, No Parasternal Heave +ve B.Sounds, Abd Soft, No tenderness, No organomegaly appriciated, No rebound - guarding or rigidity. No Cyanosis, Clubbing or edema, No new Rash or bruise     Data  Review   Micro Results Recent Results (from the past 240 hour(s))  CSF culture     Status: None (Preliminary result)   Collection Time: 12/04/14 12:32 PM  Result Value Ref Range Status   Specimen Description CSF  Final   Special Requests CSF FLUID NO2 2.5ML  Final   Gram Stain   Final    CYTOSPIN NO WBC SEEN NO ORGANISMS SEEN Performed at [facility] Performed at [facility]    Culture   Final    NO GROWTH 1 DAY Performed at [facility]    Report Status PENDING  Incomplete  Gram stain     Status: None   Collection Time: 12/04/14 12:32 PM  Result Value Ref Range Status   Specimen Description CSF  Final   Special Requests NONE  Final   Gram Stain   Final    WBC PRESENT, PREDOMINANTLY MONONUCLEAR NO ORGANISMS SEEN CYTOSPIN SLIDE    Report Status 12/04/2014 FINAL  Final    Radiology Reports Dg Fluoro Guide Ndl Plc/bx  12/05/2014   CLINICAL DATA:  Peripheral neuropathy  EXAM: DIAGNOSTIC LUMBAR PUNCTURE UNDER FLUOROSCOPIC GUIDANCE  FLUOROSCOPY TIME:  30 seconds.  PROCEDURE: Informed consent was obtained from the patient prior to the procedure, including potential complications of headache, allergy, and pain. With the patient prone, the lower back was prepped with Betadine. 1% Lidocaine was used for local anesthesia. Lumbar puncture was performed at the left L2-3 level using a 20 gauge needle with return of clear CSF with an opening pressure of 20 cm water. Tenml of CSF were obtained for laboratory studies. The patient tolerated the procedure well and there were no apparent complications.  COMPLICATIONS: None  IMPRESSION: Successful lumbar puncture for fluid.  Opening depression 20 cm water.  10 cc clear CSF obtained.   Electronically Signed   By: Maryclare Bean M.D.   On: 12/05/2014 08:11    CBC  Recent Labs Lab 12/03/14 2133 12/04/14 0720 12/05/14 0446 12/06/14 0414  WBC 9.2 9.0 9.6 18.8*  HGB 10.4* 10.3* 11.3* 10.3*  HCT 33.7* 33.1* 36.4 33.7*  PLT  386 345 407* 415*  MCV 77.3* 77.0* 77.0* 77.8*  MCH 23.9* 24.0* 23.9* 23.8*  MCHC 30.9 31.1 31.0 30.6  RDW 15.3 15.3 15.3 15.4  LYMPHSABS 2.3  --   --   --   MONOABS 0.6  --   --   --   EOSABS 0.1  --   --   --   BASOSABS 0.0  --   --   --     Chemistries   Recent Labs Lab 12/03/14 2133 12/04/14 0720 12/05/14 0446 12/06/14 0414  NA 139  --  138 138  K 4.0  --  4.0 4.2  CL 104  --  107 106  CO2 28  --  23 23  GLUCOSE 137*  --  185* 170*  BUN 8  --  8 11  CREATININE 0.85 0.81 0.80 0.73  CALCIUM 9.1  --  9.2 9.2  AST 22  --   --   --   ALT 23  --   --   --   ALKPHOS 91  --   --   --   BILITOT 0.7  --   --   --    ------------------------------------------------------------------------------------------------------------------ estimated creatinine clearance is 130.1 mL/min (by C-G formula based on Cr of 0.73). ------------------------------------------------------------------------------------------------------------------ No results for input(s): HGBA1C in the last 72 hours. ------------------------------------------------------------------------------------------------------------------ No results for input(s): CHOL, HDL, LDLCALC, TRIG, CHOLHDL, LDLDIRECT in the last 72 hours. ------------------------------------------------------------------------------------------------------------------  Recent Labs  12/04/14 0720  TSH 2.041   ------------------------------------------------------------------------------------------------------------------  Recent Labs  12/04/14 0720  VITAMINB12 373    Coagulation profile No results for input(s): INR, PROTIME in the last 168 hours.  No results for input(s): DDIMER in the last 72 hours.  Cardiac Enzymes No results for input(s): CKMB, TROPONINI, MYOGLOBIN in the last 168 hours.  Invalid input(s): CK ------------------------------------------------------------------------------------------------------------------ Invalid  input(s): POCBNP     Time Spent in minutes   25 minutes   Jahron Hunsinger M.D on 12/06/2014 at 10:39 AM  Between 7am to 7pm - Pager - [phone]  After 7pm go to [email or web address] - password [email or web address]  And look for the night coverage person covering for me after hours  [facility] Office  [phone]   **Disclaimer: This note may have been dictated with voice recognition software. Similar sounding words can inadvertently be transcribed and this note may contain transcription errors which may not have been corrected upon publication of note.**

## 2014-12-06 NOTE — Progress Notes (Signed)
Subjective: Patient reports improvement in the paresthetic sensation but no change in its distribution.  Tolerating steroids.    Objective: Current vital signs: BP 128/56 mmHg  Pulse 82  Temp(Src) 98.1 F (36.7 C) (Oral)  Resp 20  Ht 5\' 4"  (1.626 m)  Wt 136.079 kg (300 lb)  BMI 51.47 kg/m2  SpO2 98%  LMP 11/26/2014 Vital signs in last 24 hours: Temp:  [98.1 F (36.7 C)-98.7 F (37.1 C)] 98.1 F (36.7 C) (01/24 1019) Pulse Rate:  [80-95] 82 (01/24 1019) Resp:  [18-20] 20 (01/24 1019) BP: (126-148)/(56-75) 128/56 mmHg (01/24 1019) SpO2:  [95 %-98 %] 98 % (01/24 1019)  Intake/Output from previous day:   Intake/Output this shift:   Nutritional status: Diet regular  Neurologic Exam: General: NAD Mental Status: Alert, oriented, thought content appropriate. Speech fluent without evidence of aphasia. Able to follow 3 step commands without difficulty. Cranial Nerves: II: Discs flat bilaterally; Visual fields grossly normal, pupils equal, round, reactive to light and accommodation III,IV, VI: ptosis not present, extra-ocular motions intact bilaterally V,VII: smile symmetric, facial light touch sensation normal bilaterally VIII: hearing normal bilaterally IX,X: gag reflex present XI: bilateral shoulder shrug XII: midline tongue extension without atrophy or fasciculations Motor: 5/5 throughout Sensory: Decreased sensation below, above the breast line  Deep Tendon Reflexes:  2+ throughout Plantars: Right: downgoingLeft: downgoing Cerebellar: normal finger-to-nose and normal heel-to-shin testing bilaterally    Lab Results: Basic Metabolic Panel:  Recent Labs Lab 12/03/14 2133 12/04/14 0720 12/05/14 0446 12/06/14 0414  NA 139  --  138 138  K 4.0  --  4.0 4.2  CL 104  --  107 106  CO2 28  --  23 23  GLUCOSE 137*  --  185* 170*  BUN 8  --  8 11  CREATININE 0.85 0.81 0.80 0.73  CALCIUM 9.1  --  9.2 9.2    Liver Function  Tests:  Recent Labs Lab 12/03/14 2133  AST 22  ALT 23  ALKPHOS 91  BILITOT 0.7  PROT 6.8  ALBUMIN 3.6   No results for input(s): LIPASE, AMYLASE in the last 168 hours. No results for input(s): AMMONIA in the last 168 hours.  CBC:  Recent Labs Lab 12/03/14 2133 12/04/14 0720 12/05/14 0446 12/06/14 0414  WBC 9.2 9.0 9.6 18.8*  NEUTROABS 6.2  --   --   --   HGB 10.4* 10.3* 11.3* 10.3*  HCT 33.7* 33.1* 36.4 33.7*  MCV 77.3* 77.0* 77.0* 77.8*  PLT 386 345 407* 415*    Cardiac Enzymes: No results for input(s): CKTOTAL, CKMB, CKMBINDEX, TROPONINI in the last 168 hours.  Lipid Panel: No results for input(s): CHOL, TRIG, HDL, CHOLHDL, VLDL, LDLCALC in the last 168 hours.  CBG: No results for input(s): GLUCAP in the last 168 hours.  Microbiology: Results for orders placed or performed during the hospital encounter of 12/03/14  CSF culture     Status: None (Preliminary result)   Collection Time: 12/04/14 12:32 PM  Result Value Ref Range Status   Specimen Description CSF  Final   Special Requests CSF FLUID NO2 2.5ML  Final   Gram Stain   Final    CYTOSPIN NO WBC SEEN NO ORGANISMS SEEN Performed at [facility] Performed at [facility]    Culture   Final    NO GROWTH 2 DAYS Performed at [facility]    Report Status PENDING  Incomplete  Gram stain     Status: None   Collection  Time: 12/04/14 12:32 PM  Result Value Ref Range Status   Specimen Description CSF  Final   Special Requests NONE  Final   Gram Stain   Final    WBC PRESENT, PREDOMINANTLY MONONUCLEAR NO ORGANISMS SEEN CYTOSPIN SLIDE    Report Status 12/04/2014 FINAL  Final    Coagulation Studies: No results for input(s): LABPROT, INR in the last 72 hours.  Imaging: No results found.  Medications:  I have reviewed the patient's current medications. Scheduled: . heparin subcutaneous  5,000 Units Subcutaneous 3 times per day  . methylPREDNISolone (SOLU-MEDROL) injection   1,000 mg Intravenous Daily  . nystatin   Topical BID  . pantoprazole (PROTONIX) IV  40 mg Intravenous Q24H  . Vitamin D (Ergocalciferol)  50,000 Units Oral Q7 days    Assessment/Plan: Patient continues to have the paresthesias.  Vitamin D has been initiated.  Now 3/5 doses of Solumedrol.  No further lab tests have returned.    Recommendations: 1.  Continue steroids for 5 doses 2.  Continue PT 3.  Will continue to follow up lab tests   LOS: 3 days   Thana Farr, MD [facility] [phone] 12/06/2014  1:12 PM

## 2014-12-07 ENCOUNTER — Inpatient Hospital Stay (HOSPITAL_COMMUNITY): Payer: Medicaid Other

## 2014-12-07 ENCOUNTER — Encounter (HOSPITAL_COMMUNITY): Payer: Self-pay | Admitting: Radiology

## 2014-12-07 LAB — BASIC METABOLIC PANEL
Anion gap: 11 (ref 5–15)
BUN: 17 mg/dL (ref 6–23)
CO2: 23 mmol/L (ref 19–32)
CREATININE: 0.74 mg/dL (ref 0.50–1.10)
Calcium: 9.1 mg/dL (ref 8.4–10.5)
Chloride: 107 mmol/L (ref 96–112)
Glucose, Bld: 147 mg/dL — ABNORMAL HIGH (ref 70–99)
Potassium: 4.8 mmol/L (ref 3.5–5.1)
SODIUM: 141 mmol/L (ref 135–145)

## 2014-12-07 LAB — PATHOLOGIST SMEAR REVIEW

## 2014-12-07 LAB — ANA: ANA: NEGATIVE

## 2014-12-07 LAB — CBC
HEMATOCRIT: 32.4 % — AB (ref 36.0–46.0)
Hemoglobin: 10.4 g/dL — ABNORMAL LOW (ref 12.0–15.0)
MCH: 24.6 pg — AB (ref 26.0–34.0)
MCHC: 32.1 g/dL (ref 30.0–36.0)
MCV: 76.8 fL — AB (ref 78.0–100.0)
Platelets: 403 10*3/uL — ABNORMAL HIGH (ref 150–400)
RBC: 4.22 MIL/uL (ref 3.87–5.11)
RDW: 15.4 % (ref 11.5–15.5)
WBC: 15.8 10*3/uL — ABNORMAL HIGH (ref 4.0–10.5)

## 2014-12-07 LAB — TESTOSTERONE, % FREE: TESTOSTERONE-% FREE: 1.9 % — AB (ref 0.4–2.4)

## 2014-12-07 LAB — TESTOSTERONE, FREE: Testosterone, Free: 8.5 pg/mL — ABNORMAL HIGH (ref 0.6–6.8)

## 2014-12-07 MED ORDER — SODIUM CHLORIDE 0.9 % IJ SOLN
10.0000 mL | INTRAMUSCULAR | Status: DC | PRN
  Administered 2014-12-12: 10 mL
  Filled 2014-12-07: qty 40

## 2014-12-07 MED ORDER — CEFTRIAXONE SODIUM IN DEXTROSE 40 MG/ML IV SOLN
2.0000 g | INTRAVENOUS | Status: DC
  Administered 2014-12-07 – 2014-12-11 (×5): 2 g via INTRAVENOUS
  Filled 2014-12-07 (×7): qty 50

## 2014-12-07 MED ORDER — IOHEXOL 300 MG/ML  SOLN
100.0000 mL | Freq: Once | INTRAMUSCULAR | Status: AC | PRN
  Administered 2014-12-07: 100 mL via INTRAVENOUS

## 2014-12-07 NOTE — Progress Notes (Signed)
ANTIBIOTIC CONSULT NOTE - INITIAL  Pharmacy Consult for Ceftriaxone Indication: UTI  No Known Allergies  Patient Measurements: Height:  (162.6 cm) Weight: 300 lb (136.079 kg) IBW/kg (Calculated) : 54.7  Vital Signs: Temp: 98.1 F (36.7 C) (01/25 0121) Temp Source: Oral (01/25 0121) BP: 160/82 mmHg (01/25 0121) Pulse Rate: 72 (01/25 0121)  Labs:  Recent Labs  12/04/14 0720 12/05/14 0446 12/06/14 0414  WBC 9.0 9.6 18.8*  HGB 10.3* 11.3* 10.3*  PLT 345 407* 415*  CREATININE 0.81 0.80 0.73   Estimated Creatinine Clearance: 130.1 mL/min (by C-G formula based on Cr of 0.73).   Medical History: Past Medical History  Diagnosis Date  . Obesity     Assessment: Ceftriaxone for UTI. WBC 18.8. Renal function good.   Plan:  -Ceftriaxone 2g IV q24h -Trend WBC, temp, urine cx  Abran Duke 12/07/2014,1:57 AM

## 2014-12-07 NOTE — Progress Notes (Signed)
Physical Therapy Treatment Patient Details Name: Jessica Cisneros MRN: [id number] DOB: Jul 24, 1976 Today's Date: 12/07/2014    History of Present Illness   Pt is a 39 y.o. Female admitted 12/03/14 with ascending parasthesia with c/o numbness and tingling for 3 weeks of her LEs, torso up to chest, and hands. 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 Pt currently receiving solumedrol treatments. (? working diagnosis is transverse myelitis)    PT Comments    Pt's gait is improving. Anticipate she will continue to progress if able to walk more frequently (discussed with RN and she agrees to ambulate with pt today). Main barrier from a PT perspective is pt's ability to ascend her 33" step up to her porch. She did well/better with stairs today. Will attempt to fabricate a way to simulate this step height for practice.   Follow Up Recommendations  Home health PT ( however pt may not qualify due to [facility] regulations)     Equipment Recommendations  Rolling walker with 5" wheels (bariatric) may not need upon d/c--To further assess   Recommendations for Other Services       Precautions / Restrictions Precautions Precautions: Fall Precaution Comments: supervision when up, use RW for safety with non-therapy staff Restrictions Weight Bearing Restrictions: No    Mobility  Bed Mobility Overal bed mobility: Needs Assistance Bed Mobility: Supine to Sit;Rolling Rolling: Supervision   Supine to sit: Supervision;HOB elevated     General bed mobility comments: without use of bed rails  Transfers Overall transfer level: Modified independent Equipment used: None Transfers: Sit to/from Stand Sit to Stand: Modified independent (Device/Increase time)         General transfer comment: No unsteadiness noted x 3 (beginning and end of  session); + use of UEs on armrests  Ambulation/Gait Ambulation/Gait assistance: Min guard;Supervision Ambulation Distance (Feet): 300 Feet Assistive device: None Gait Pattern/deviations: Step-through pattern;Wide base of support Gait velocity: able to vary up and down Gait velocity interpretation: Below normal speed for age/gender General Gait Details: gait velocity at her baseline per pt; remains slightly guarded with gait due to paresthesias bil feet, however no staggering, drifting or unsteadiness noted; pt eager to walk with family and spoke to RN re: PT feels this would be appropriate. RN will continue to monitor   Stairs Stairs: Yes Stairs assistance: Min guard Stair Management: No rails;One rail Left;Alternating pattern;Step to pattern;Forwards Number of Stairs: 5 (x 5) General stair comments: initially with one rail (simulating steps to second story); progressed to no rail (as at entrance) however was unable to simulate the height of her entry step (pt estimated 16")  Wheelchair Mobility    Modified Rankin (Stroke Patients Only)       Balance Overall balance assessment: Needs assistance Sitting-balance support: Feet supported;No upper extremity supported Sitting balance-Leahy Scale: Good     Standing balance support: Bilateral upper extremity supported Standing balance-Leahy Scale: Good                      Cognition Arousal/Alertness: Awake/alert Behavior During Therapy: WFL for tasks assessed/performed (tearful at times) Overall Cognitive Status: Within Functional Limits for tasks assessed                      Exercises      General Comments General comments (skin integrity, edema, etc.): feels pins/needles may  feel a bit worse today (states she was diagnosed with UTI and wonders if this is related to feeling worse)      Pertinent Vitals/Pain Pain Assessment: No/denies pain (reports pins and needles arms down)    Home Living                       Prior Function            PT Goals (current goals can now be found in the care plan section) Acute Rehab PT Goals Patient Stated Goal: to feel normal again Progress towards PT goals: Progressing toward goals    Frequency  Min 3X/week    PT Plan Current plan remains appropriate    Co-evaluation             End of Session Equipment Utilized During Treatment: Gait belt Activity Tolerance: Patient tolerated treatment well Patient left: in chair;with call bell/phone within reach;with family/visitor present     Time: 1130-1202 PT Time Calculation (min) (ACUTE ONLY): 32 min  Charges:  $Gait Training: 23-37 mins                    G Codes:      Camielle Sizer 01/05/2015, 12:18 PM Pager [phone]

## 2014-12-07 NOTE — Clinical Documentation Improvement (Signed)
Supporting Information:  Obesity noted per 12/04/14 progress notes.  Body mass index is 51.47 kg/(m^2). Patient meets criteria for Morbid Obesity based on current BMI per RD's 12/04/14 evaluation.    Possible Clinical Condition: . Obesity --Morbid (severe) Due to excess calories With alveolar hypoventilation (Pickwickian syndrome) --Drug Induced Document drug --Other Due to excess calories, familial, endocrine . Overweight . Body Mass Index (BMI) . Document any associated diagnoses/conditions    Thank Gabriel Cirri Documentation Specialist [phone] Karinda [email or web address]

## 2014-12-07 NOTE — Progress Notes (Signed)
Occupational Therapy Treatment Patient Details Name: Jessica Cisneros MRN: [id number] DOB: Oct 13, 1976 Today's Date: 12/07/2014    History of present illness Pt is a 39 y.o. Female admitted 12/03/14 with ascending parasthesia with c/o numbness and tingling for 3 weeks of her LEs, torso up to chest, and hands. 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 Pt currently receiving solumedrol treatments.    OT comments  Patient progressing towards OT goals. Continue plan of care for now. Patient reports she does not have much support at home. Patient expressed interest in social worker getting involved in her case, left SW voicemail.    Follow Up Recommendations  No OT follow up;Supervision/Assistance - 24 hour    Equipment Recommendations  None recommended by OT    Recommendations for Other Services  SW consult    Precautions / Restrictions Precautions Precautions: Fall Precaution Comments: supervision when up, use RW for safety with non-therapy staff Restrictions Weight Bearing Restrictions: No       Mobility Bed Mobility Overal bed mobility: Needs Assistance Bed Mobility: Supine to Sit;Rolling Rolling: Supervision   Supine to sit: Supervision;HOB elevated     General bed mobility comments: without use of bed rails  Transfers Overall transfer level: Needs assistance Equipment used: Rolling walker (2 wheeled) Transfers: Sit to/from Stand Sit to Stand: Supervision         General transfer comment: Supervision for safety and hand placement     Balance Overall balance assessment: Needs assistance Sitting-balance support: Feet supported;No upper extremity supported Sitting balance-Leahy Scale: Good      Standing balance support: Bilateral upper extremity supported Standing balance-Leahy Scale: Good   ADL Overall ADL's : Needs assistance/impaired         General ADL Comments: Patient able to cross BLEs to doff bilateral socks, but unable tto functionally don sock secondary to numbness. Educated patient on use of sock aid. Patient then engaged in functional ambulation/mobility/transfer onto elevated toilet seat. Educated patient on importance of not using clawfoot tub at this time secondary to unsafe. Recommend patient use mothers walk-in shower with removable seat. Patient ambulated with RW and therapist encouraged this for safey. Educated patient on compensatory strategies secondary to decreased sensation, including donning shoes for safety, using eyes for extra sense, etc.               Cognition   Behavior During Therapy: WFL for tasks assessed/performed Overall Cognitive Status: Within Functional Limits for tasks assessed                 Pertinent Vitals/ Pain       Pain Assessment: No/denies pain         Frequency Min 2X/week     Progress Toward Goals  OT Goals(current goals can now be found in the care plan section)  Progress towards OT goals: Progressing toward goals     Plan Discharge plan remains appropriate       End of Session Equipment Utilized During Treatment: Rolling walker   Activity Tolerance Patient tolerated treatment well   Patient Left in chair;with call bell/phone within reach;with family/visitor present     Time: 1478-2956 OT Time Calculation (min): 38 min  Charges: OT General Charges $OT Visit: 1 Procedure OT  Treatments $Self Care/Home Management : 23-37 mins $Therapeutic Activity: 8-22 mins  Edithe Dobbin , MS, OTR/L, CLT Pager: [phone]  12/07/2014, 11:31 AM

## 2014-12-07 NOTE — Progress Notes (Signed)
Subjective: Feels more tingling.   Exam: Filed Vitals:   12/07/14 1007  BP: 134/72  Pulse: 75  Temp: 98.3 F (36.8 C)  Resp: 20   Gen: In bed, NAD MS: awake, alert, interactive and appropriate LK:GMWNU, EOMI Motor: 5/5 Sensory: decreased to temp in teh arms and legs.    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 Workup is ongoing and she is receiving IV steroids day #4.   Recommendations: 1) Will add CT chest, ACE to eval for sarcoid.  2) will add serum NMO in addition to CSF NMO.  3) will continue to follow.   Ritta Slot, MD [facility] [phone]  If 7pm- 7am, please page neurology on call as listed in AMION.

## 2014-12-07 NOTE — Progress Notes (Addendum)
Patient Demographics  Jessica Cisneros, is a 39 y.o. female, DOB - 20-Oct-1976, EZM:[id number]  Admit date - 12/03/2014   Admitting Physician Houston Siren, MD  Outpatient Primary MD for the patient is Pcp Not In System  LOS - 4   Chief Complaint  Patient presents with  . Numbness       Admission history of present illness/brief narrative:  Jessica Cisneros is an 39 y.o. female  presents to the ER complaining of feeling numbness and tingling starting on her hip and progressing cephalad and now on her chest. She denied weakness, pain, blurry vision, stiffneck, fever, or chills. She has some HA, but no stiffneck. She denied any rash.  Evaluation in the ER showed normal serology. Neurology was consulted, recommended hospitalist to admit for further work up to include imaging. Her mother has Multiple Sclerosis. She has no weakness nor pain. 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 No Cough - SOB.   Assessment & Plan    Active Problems:   Paresthesia   PCOS (polycystic ovarian syndrome)   Obesity   Low serum vitamin D   Transverse myelitis  Paresthesia,  - MRI showing evidence of acute demyelination in C4-C6 . - Neurology on board,  -LP showing normal protein and glucose level and normal cell count. - Started on IV Solu Medrol 1 g daily , to finish total of 5 days , today is day #3 ,by in neurology for possible autoimmune disorders  ( transverse myelitis?) - TSH within normal limits, follow on , Lyme titer, B-1, antiphospholipid . - TSH and B-12 within normal limits, RPR nonreactive, ANA negative, HIV nonreactive. - Low vitamin D level, started on vitamin D supplement. - PT/OT consult, out of bed to chair.  Low vitamin D - cont with supplement.  Leukocytosis - This is most likely related to her steroids  Bacteriuria, symptomatic - Will start patient on IV Rocephin for UTI treatment, will follow on urine cultures.  Morbid obesity - We will consult dietitian.     Code Status: Full  Family Communication: Family at bedside  Disposition Plan: Home once stable   Procedures  LP 1/22   Consults   Neurology   Medications  Scheduled Meds: . cefTRIAXone (ROCEPHIN)  IV  2 g Intravenous Q24H  . heparin subcutaneous  5,000 Units Subcutaneous 3 times per day  . methylPREDNISolone (SOLU-MEDROL) injection  1,000 mg Intravenous Daily  . nystatin   Topical BID  . pantoprazole  40 mg Oral Daily  . Vitamin D (Ergocalciferol)  50,000 Units Oral Q7 days   Continuous Infusions:  PRN Meds:.  DVT Prophylaxis  subcutaneous heparin  Lab Results  Component Value Date   PLT 403* 12/07/2014    Antibiotics    Anti-infectives    Start     Dose/Rate Route Frequency  Ordered Stop   12/07/14 0200  cefTRIAXone (ROCEPHIN) 2 g in dextrose 5 % 50 mL IVPB - Premix     2 g100 mL/hr over 30 Minutes Intravenous Every 24 hours 12/07/14 0156            Objective:   Filed Vitals:   12/06/14 2300 12/07/14 0121 12/07/14 0644 12/07/14 1007  BP: 128/69 160/82 144/71 134/72  Pulse: 67 72 63 75  Temp: 98.2 F (36.8 C) 98.1 F (36.7 C) 98.1 F (36.7 C) 98.3 F (36.8 C)  TempSrc: Oral Oral Oral Oral  Resp: Height:      Weight:      SpO2: 97% 97% 96% 96%    Wt Readings from Last 3 Encounters:  12/03/14 136.079 kg (300 lb)     Intake/Output Summary (Last 24 hours) at 12/07/14 1251 Last data filed  at 12/07/14 1610  Gross per 24 hour  Intake    240 ml  Output      0 ml  Net    240 ml     Physical Exam  Awake Alert, Oriented X 3, No new F.N deficits, Normal affect [place].AT,PERRAL Supple Neck,No JVD, No cervical lymphadenopathy appriciated.  Symmetrical Chest wall movement, Good air movement bilaterally, CTAB RRR,No Gallops,Rubs or new Murmurs, No Parasternal Heave +ve B.Sounds, Abd Soft, No tenderness, No organomegaly appriciated, No rebound - guarding or rigidity. No Cyanosis, Clubbing or edema, No new Rash or bruise     Data Review   Micro Results Recent Results (from the past 240 hour(s))  CSF culture     Status: None (Preliminary result)   Collection Time: 12/04/14 12:32 PM  Result Value Ref Range Status   Specimen Description CSF  Final   Special Requests CSF FLUID NO2 2.5ML  Final   Gram Stain   Final    CYTOSPIN NO WBC SEEN NO ORGANISMS SEEN Performed at [facility] Performed at [facility]    Culture   Final    NO GROWTH 2 DAYS Performed at [facility]    Report Status PENDING  Incomplete  Gram stain     Status: None   Collection Time: 12/04/14 12:32 PM  Result Value Ref Range Status   Specimen Description CSF  Final   Special Requests NONE  Final   Gram Stain   Final    WBC PRESENT, PREDOMINANTLY MONONUCLEAR NO ORGANISMS SEEN CYTOSPIN SLIDE    Report Status 12/04/2014 FINAL  Final    Radiology Reports No results found.  CBC  Recent Labs Lab 12/03/14 2133 12/04/14 0720 12/05/14 0446 12/06/14 0414 12/07/14 0445  WBC 9.2 9.0 9.6 18.8* 15.8*  HGB 10.4* 10.3* 11.3* 10.3* 10.4*  HCT 33.7* 33.1* 36.4 33.7* 32.4*  PLT 386 345 407* 415* 403*  MCV 77.3* 77.0* 77.0* 77.8* 76.8*  MCH 23.9* 24.0* 23.9* 23.8* 24.6*  MCHC 30.9 31.1 31.0 30.6 32.1  RDW 15.3 15.3 15.3 15.4 15.4  LYMPHSABS 2.3  --   --   --   --   MONOABS 0.6  --   --   --   --   EOSABS 0.1  --   --   --   --   BASOSABS 0.0  --   --   --   --      Chemistries   Recent Labs Lab 12/03/14 2133 12/04/14 0720 12/05/14 0446 12/06/14 0414 12/07/14 0445  NA 139  --  138 138 141  K  4.0  --  4.0 4.2 4.8  CL 104  --  107 106 107  CO2 28  --  GLUCOSE 137*  --  185* 170* 147*  BUN 8  --  CREATININE 0.85 0.81 0.80 0.73 0.74  CALCIUM 9.1  --  9.2 9.2 9.1  AST 22  --   --   --   --   ALT 23  --   --   --   --   ALKPHOS 91  --   --   --   --   BILITOT 0.7  --   --   --   --    ------------------------------------------------------------------------------------------------------------------ estimated creatinine clearance is 130.1 mL/min (by C-G formula based on Cr of 0.74). ------------------------------------------------------------------------------------------------------------------ No results for input(s): HGBA1C in the last 72 hours. ------------------------------------------------------------------------------------------------------------------ No results for input(s): CHOL, HDL, LDLCALC, TRIG, CHOLHDL, LDLDIRECT in the last 72 hours. ------------------------------------------------------------------------------------------------------------------ No results for input(s): TSH, T4TOTAL, T3FREE, THYROIDAB in the last 72 hours.  Invalid input(s): FREET3 ------------------------------------------------------------------------------------------------------------------ No results for input(s): VITAMINB12, FOLATE, FERRITIN, TIBC, IRON, RETICCTPCT in the last 72 hours.  Coagulation profile No results for input(s): INR, PROTIME in the last 168 hours.  No results for input(s): DDIMER in the last 72 hours.  Cardiac Enzymes No results for input(s): CKMB, TROPONINI, MYOGLOBIN in the last 168 hours.  Invalid input(s): CK ------------------------------------------------------------------------------------------------------------------ Invalid input(s): POCBNP     Time Spent in minutes   25  minutes   Ruthe Roemer M.D on 12/07/2014 at 12:51 PM  Between 7am to 7pm - Pager - [phone]  After 7pm go to [email or web address] - password [email or web address]  And look for the night coverage person covering for me after hours  [facility] Office  [phone]   **Disclaimer: This note may have been dictated with voice recognition software. Similar sounding words can inadvertently be transcribed and this note may contain transcription errors which may not have been corrected upon publication of note.**

## 2014-12-07 NOTE — Progress Notes (Signed)
Peripherally Inserted Central Catheter/Midline Placement  The IV Nurse has discussed with the patient and/or persons authorized to consent for the patient, the purpose of this procedure and the potential benefits and risks involved with this procedure.  The benefits include less needle sticks, lab draws from the catheter and patient may be discharged home with the catheter.  Risks include, but not limited to, infection, bleeding, blood clot (thrombus formation), and puncture of an artery; nerve damage and irregular heat beat.  Alternatives to this procedure were also discussed.  PICC/Midline Placement Documentation  PICC / Midline Single Lumen 12/07/14 PICC Right Basilic 45 cm 2 cm (Active)  Indication for Insertion or Continuance of Line Poor Vasculature-patient has had multiple peripheral attempts or PIVs lasting less than 24 hours 12/07/2014  6:26 PM  Exposed Catheter (cm) 2 cm 12/07/2014  6:26 PM  Dressing Change Due 12/14/14 12/07/2014  6:26 PM       Stacie Glaze Horton 12/07/2014, 6:27 PM

## 2014-12-08 ENCOUNTER — Inpatient Hospital Stay (HOSPITAL_COMMUNITY): Payer: Medicaid Other

## 2014-12-08 DIAGNOSIS — G0489 Other myelitis: Secondary | ICD-10-CM

## 2014-12-08 LAB — ANTIPHOSPHOLIPID SYNDROME EVAL, BLD
Anticardiolipin IgA: 8 APL U/mL — ABNORMAL LOW (ref <22)
Anticardiolipin IgG: 4 GPL U/mL — ABNORMAL LOW (ref <23)
Anticardiolipin IgM: 4 MPL U/mL — ABNORMAL LOW (ref <11)
DRVVT: 43.8 s — AB (ref <42.9)
LUPUS ANTICOAGULANT: NOT DETECTED
PHOSPHATYDALSERINE, IGA: 10 U/mL (ref <20)
PHOSPHATYDALSERINE, IGG: 6 U/mL (ref <16)
PTT Lupus Anticoagulant: 37.3 secs (ref 28.0–43.0)
Phosphatydalserine, IgM: 13 U/mL (ref <22)
dRVVT Incubated 1:1 Mix: 40.4 secs (ref <42.9)

## 2014-12-08 LAB — URINE CULTURE

## 2014-12-08 LAB — PROTIME-INR
INR: 1.04 (ref 0.00–1.49)
Prothrombin Time: 13.7 seconds (ref 11.6–15.2)

## 2014-12-08 LAB — CSF CULTURE
CULTURE: NO GROWTH
GRAM STAIN: NONE SEEN

## 2014-12-08 LAB — T4, FREE: FREE T4: 1.26 ng/dL (ref 0.80–1.80)

## 2014-12-08 LAB — CSF CULTURE W GRAM STAIN

## 2014-12-08 LAB — ANGIOTENSIN CONVERTING ENZYME: Angiotensin-Converting Enzyme: 16 U/L (ref 8–52)

## 2014-12-08 LAB — TSH: TSH: 0.834 u[IU]/mL (ref 0.350–4.500)

## 2014-12-08 MED ORDER — ACETAMINOPHEN 325 MG PO TABS
ORAL_TABLET | ORAL | Status: AC
  Filled 2014-12-08: qty 2

## 2014-12-08 MED ORDER — LEVOFLOXACIN 500 MG PO TABS: 500.0000 mg | ORAL_TABLET | Freq: Every day | ORAL | Status: DC

## 2014-12-08 MED ORDER — HEPARIN SODIUM (PORCINE) 1000 UNIT/ML IJ SOLN
1000.0000 [IU] | Freq: Once | INTRAMUSCULAR | Status: DC
  Filled 2014-12-08: qty 1

## 2014-12-08 MED ORDER — FENTANYL CITRATE 0.05 MG/ML IJ SOLN
INTRAMUSCULAR | Status: AC | PRN
  Administered 2014-12-08 (×2): 50 ug via INTRAVENOUS

## 2014-12-08 MED ORDER — SODIUM CHLORIDE 0.9 % IV SOLN
INTRAVENOUS | Status: AC
  Filled 2014-12-08 (×4): qty 200

## 2014-12-08 MED ORDER — CALCIUM GLUCONATE 10 % IV SOLN
4.0000 g | Freq: Once | INTRAVENOUS | Status: AC
  Administered 2014-12-08: 4 g via INTRAVENOUS
  Filled 2014-12-08: qty 40

## 2014-12-08 MED ORDER — FENTANYL CITRATE 0.05 MG/ML IJ SOLN
INTRAMUSCULAR | Status: AC
  Filled 2014-12-08: qty 4

## 2014-12-08 MED ORDER — CALCIUM CARBONATE ANTACID 500 MG PO CHEW: 2.0000 | CHEWABLE_TABLET | ORAL | Status: DC

## 2014-12-08 MED ORDER — DEXTROSE 5 % IV SOLN
3.0000 g | INTRAVENOUS | Status: AC
  Administered 2014-12-08: 3 g via INTRAVENOUS
  Filled 2014-12-08 (×2): qty 3000

## 2014-12-08 MED ORDER — ACETAMINOPHEN 325 MG PO TABS: 650.0000 mg | ORAL_TABLET | ORAL | Status: DC | PRN

## 2014-12-08 MED ORDER — CALCIUM CARBONATE ANTACID 500 MG PO CHEW
CHEWABLE_TABLET | ORAL | Status: AC
  Filled 2014-12-08: qty 2

## 2014-12-08 MED ORDER — ACETAMINOPHEN 325 MG PO TABS
650.0000 mg | ORAL_TABLET | ORAL | Status: DC | PRN
Start: 2014-12-08 — End: 2014-12-09
  Administered 2014-12-08: 650 mg via ORAL

## 2014-12-08 MED ORDER — CALCIUM CARBONATE ANTACID 500 MG PO CHEW
2.0000 | CHEWABLE_TABLET | ORAL | Status: AC
  Administered 2014-12-08 (×2): 400 mg via ORAL

## 2014-12-08 MED ORDER — ACD FORMULA A 0.73-2.45-2.2 GM/100ML VI SOLN
500.0000 mL | Status: DC
  Filled 2014-12-08: qty 500

## 2014-12-08 MED ORDER — MIDAZOLAM HCL 2 MG/2ML IJ SOLN
INTRAMUSCULAR | Status: AC
  Filled 2014-12-08: qty 4

## 2014-12-08 MED ORDER — LIDOCAINE HCL 1 % IJ SOLN
INTRAMUSCULAR | Status: AC
  Filled 2014-12-08: qty 20

## 2014-12-08 MED ORDER — SODIUM CHLORIDE 0.9 % IV SOLN
4.0000 g | Freq: Once | INTRAVENOUS | Status: DC
  Filled 2014-12-08: qty 40

## 2014-12-08 MED ORDER — HEPARIN SODIUM (PORCINE) 1000 UNIT/ML IJ SOLN
INTRAMUSCULAR | Status: AC
  Filled 2014-12-08: qty 1

## 2014-12-08 MED ORDER — VITAMIN D (ERGOCALCIFEROL) 1.25 MG (50000 UNIT) PO CAPS: 50000.0000 [IU] | ORAL_CAPSULE | ORAL | Status: DC

## 2014-12-08 MED ORDER — ALBUMIN HUMAN 25 % IV SOLN
Freq: Once | INTRAVENOUS | Status: AC
  Administered 2014-12-08: 21:00:00 via INTRAVENOUS_CENTRAL
  Filled 2014-12-08: qty 200

## 2014-12-08 MED ORDER — SODIUM CHLORIDE 0.9 % IV SOLN
Freq: Once | INTRAVENOUS | Status: DC
  Filled 2014-12-08: qty 200

## 2014-12-08 MED ORDER — MIDAZOLAM HCL 2 MG/2ML IJ SOLN
INTRAMUSCULAR | Status: AC | PRN
  Administered 2014-12-08 (×2): 1 mg via INTRAVENOUS

## 2014-12-08 MED ORDER — OXYCODONE-ACETAMINOPHEN 5-325 MG PO TABS: 1.0000 | ORAL_TABLET | Freq: Once | ORAL | Status: DC

## 2014-12-08 MED ORDER — ACD FORMULA A 0.73-2.45-2.2 GM/100ML VI SOLN
500.0000 mL | Status: DC
  Administered 2014-12-08 – 2014-12-11 (×2): 500 mL via INTRAVENOUS
  Filled 2014-12-08 (×3): qty 500

## 2014-12-08 MED ORDER — HEPARIN SODIUM (PORCINE) 5000 UNIT/ML IJ SOLN
5000.0000 [IU] | Freq: Three times a day (TID) | INTRAMUSCULAR | Status: DC
  Administered 2014-12-08 – 2014-12-12 (×12): 5000 [IU] via SUBCUTANEOUS
  Filled 2014-12-08 (×12): qty 1

## 2014-12-08 MED ORDER — DIPHENHYDRAMINE HCL 25 MG PO CAPS: 25.0000 mg | ORAL_CAPSULE | Freq: Four times a day (QID) | ORAL | Status: DC | PRN

## 2014-12-08 NOTE — Progress Notes (Signed)
Occupational Therapy Treatment Patient Details Name: Jessica Cisneros MRN: [id number] DOB: [birth year] Today's Date: 12/08/2014    History of present illness   Pt is a 39 y.o. Female admitted 12/03/14 with ascending parasthesia with c/o numbness and tingling for 3 weeks of her LEs, torso up to chest, and hands. 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 Pt currently receiving solumedrol treatments. (? working diagnosis is transverse myelitis)   OT comments  Patient progressing towards OT goals, continue plan of care for now. Patient continues to require up to set-up/supervision for functional tasks secondary to decreased sensation. Encouraged patient to call rebuilding together organization for modifications > home. Patient with increased tearfulness during session once family left.    Follow Up Recommendations  No OT follow up;Supervision/Assistance - 24 hour    Equipment Recommendations  None recommended by OT    Recommendations for Other Services  None at this time    Precautions / Restrictions Precautions Precautions: Fall Restrictions Weight Bearing Restrictions: No       Mobility - Per PT note Transfers Overall transfer level: Modified independent Equipment used: None Transfers: Sit to/from Stand Sit to Stand: Modified independent (Device/Increase time)         General transfer comment: No unsteadiness noted x 3 (beginning and end of session); + use of UEs on armrests    Balance - Per PT note Standing balance-Leahy Scale: Good    ADL       Grooming: Wash/dry hands;Wash/dry face;Oral care;Standing;Set up;Cueing for compensatory techniques     General ADL Comments: Patient requires supervision/set-up assist for most ADLs, min assist for LB dressing secondary to decreased sensation. Patient able to functionally  ambulate and perform functional transfers at a distant supervision>independent level. Administerred stressball and encouraged patient to engage in stress ball exercises.                 Cognition   Behavior During Therapy: WFL for tasks assessed/performed (tearful once family left) Overall Cognitive Status: Within Functional Limits for tasks assessed                Pertinent Vitals/ Pain       Pain Assessment: No/denies pain         Frequency Min 2X/week     Progress Toward Goals  OT Goals(current goals can now be found in the care plan section)  Progress towards OT goals: Progressing toward goals  Acute Rehab OT Goals Patient Stated Goal: to feel normal again  Plan Discharge plan remains appropriate          Activity Tolerance Patient tolerated treatment well   Patient Left in chair;with call bell/phone within reach     Time: 4401-0272 OT Time Calculation (min): 34 min  Charges: OT General Charges $OT Visit: 1 Procedure OT Treatments $Self Care/Home Management : 8-22 mins $Therapeutic Activity: 8-22 mins  Dewayne Jurek , MS, OTR/L, CLT Pager: [phone]  12/08/2014, 1:45 PM

## 2014-12-08 NOTE — Sedation Documentation (Signed)
Transport delivered to nurses station, [employer]

## 2014-12-08 NOTE — Progress Notes (Signed)
S/w/ floor nurse Joanie regarding Stat PT/INR. She s/w pt who stated nothing drawn recently.  Joanie to call for STAT blood draw.

## 2014-12-08 NOTE — Discharge Summary (Signed)
Jessica Cisneros, 39 y.o., DOB 06-12-1976, MRN [id number]. Admission date: 12/03/2014 Discharge Date 12/08/2014 Primary MD Pcp Not In System Admitting Physician Houston Siren, MD   PCP please follow on: - Recheck CBC, BMP, during his visit - Patient will need ultrasound-guided fine-needle aspiration of thyroid nodule, as discussed with [facility] Clent Ridges. - Patient need to follow-up with neurology Dr. Max Fickle 1-2 weeks from discharge. - Recheck vitamin D level, follow on antiphospholipid, neuromyelitis optica antibody, angiotensin converting enzyme level.  Admission Diagnosis  Paresthesias [R20.2]  Discharge Diagnosis   Active Problems:   Paresthesia   PCOS (polycystic ovarian syndrome)   Obesity   Low serum vitamin D   Transverse myelitis   Past Medical History  Diagnosis Date  . Obesity     Past Surgical History  Procedure Laterality Date  . Eye surgery    . Dilation and curettage of uterus       Hospital Course See H&P, Labs, Consult and Test reports for all details in brief, patient was admitted for   Active Problems:   Paresthesia   PCOS (polycystic ovarian syndrome)   Obesity   Low serum vitamin D   Transverse myelitis  Jessica Cisneros is an 39 y.o. female presents to the ER complaining of feeling numbness and tingling starting on her hip and progressing cephalad and now on her chest. She denied weakness, pain, blurry vision, stiffneck, fever, or chills. She has some HA, but no stiffneck. She denied any rash.  Evaluation in the ER showed normal serology. Neurology was consulted, recommended hospitalist to admit for further work up to include imaging. Her mother has Multiple Sclerosis. She has no weakness nor pain. 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  Paresthesia/ transverse myelitis: - MRI showing evidence of acute demyelination in C4-C6 . -LP showing normal protein and glucose level and normal cell count. - Started on IV Solu Medrol 1 g daily , finished total of 5 days . - TSH and B-12 within normal limits, RPR nonreactive, ANA negative, HIV nonreactive, Lyme titer negative, - Low vitamin D level, started on vitamin D supplement. - Follow with neurology as an outpatient Dr. Belvedere Callas in [place] - CT chest with contrast was obtained which did not show any evidence of sarcoidosis or lymphadenopathy, antigens in converting enzyme level still pending.  Low vitamin D - cont with supplement. - Recheck level in 6-8 weeks.  Leukocytosis - This is most likely related to her steroids  Bacteriuria, symptomatic - Will treat for UTI, she finished 2 days of IV Rocephin inpatient, will continue another 3 days of oral levofloxacin as an outpatient. - Urine culture growing multiple bacteria, not much assistance.  Morbid obesity  Incidental finding of thyroid nodule - Patient will need ultrasound-guided thyroid fine-needle biopsy as an outpatient, discussed with [facility] Cl[facility] Ridges. - TSH within normal limits, follow on free T4 level.   Consults  Neurology  Significant Tests:  See full reports for all details    Ct Chest W Contrast  12/08/2014   CLINICAL DATA:  Evaluate for lymphadenopathy, to assess for possibility of sarcoidosis. Initial encounter.  EXAM: CT CHEST WITH CONTRAST  TECHNIQUE: Multidetector CT imaging of the chest was performed  during intravenous contrast administration.  CONTRAST:  OMNIPAQUE IOHEXOL 300 MG/ML  SOLN  COMPARISON:  Thoracic spine MRI performed 12/04/2014  FINDINGS: Mild bilateral dependent subsegmental atelectasis is noted. The lungs are otherwise clear. There is no evidence of focal airspace consolidation, pleural effusion or pneumothorax. No masses are seen.  The mediastinum is unremarkable in appearance. No mediastinal lymphadenopathy is seen. No pericardial effusion is identified. The great vessels are grossly unremarkable. The patient's right PICC is noted ending about the distal SVC.  A 2.3 cm heterogeneous mass is again seen arising at the posterior aspect of the left thyroid lobe, similar in appearance to the prior MRI. No axillary lymphadenopathy is seen. Visualized axillary nodes remain normal in size.  The visualized portions of the liver and spleen are unremarkable. The visualized portions of the adrenal glands and pancreas are within normal limits.  No acute osseous abnormalities are identified.  IMPRESSION: 1. No evidence of lymphadenopathy within the chest. 2. Mild bilateral dependent subsegmental atelectasis noted; lungs otherwise clear. 3. 2.3 cm heterogeneous mass again noted arising at the posterior aspect of the left thyroid lobe, as on the recent MRI. Recommend further evaluation with thyroid ultrasound. If patient is clinically hyperthyroid, consider nuclear medicine thyroid uptake and scan.   Electronically Signed   By: Roanna Raider M.D.   On: 12/08/2014 03:10   Mr Laqueta Jean UE Contrast  12/04/2014   CLINICAL DATA:  Numbness and tingling in hips progressing to chest bilaterally over 3 weeks.  EXAM: MRI HEAD WITHOUT AND WITH CONTRAST  MRI CERVICAL SPINE WITHOUT AND WITH CONTRAST  MRI THORACIC SPINE WITHOUT AND WITH CONTRAST  TECHNIQUE: Multiplanar, multiecho pulse sequences of the brain and surrounding structures, cervical and thoracic spine, to include the craniocervical junction, were  obtained without and with intravenous contrast.  CONTRAST:  20mL MULTIHANCE GADOBENATE DIMEGLUMINE 529 MG/ML IV SOLN chest radiograph November 19, 2014 and lumbar spine radiographs November 19, 2014  COMPARISON:  None.  FINDINGS: MRI HEAD FINDINGS  The ventricles and sulci are normal for patient's age. No abnormal parenchymal signal, mass lesions, mass effect. No abnormal parenchymal enhancement. No reduced diffusion to suggest acute ischemia. No susceptibility artifact to suggest hemorrhage.  No abnormal extra-axial fluid collections. No extra-axial masses nor leptomeningeal enhancement. Normal major intracranial vascular flow voids seen at the skull base.  Ocular globes and orbital contents are unremarkable though not tailored for evaluation. No abnormal sellar expansion. Visualized paranasal sinuses and mastoid air cells are well-aerated. No suspicious calvarial bone marrow signal. No abnormal sellar expansion. Craniocervical junction maintained.  MRI CERVICAL SPINE FINDINGS  Expansile cervical spinal cord T2 bright, T1 hypointense lesion with homogeneous enhancement from C4 through C6, measuring 4.1 cm in craniocaudad dimension, with relative sparing of the lateral columns. No additional cervical spinal cord lesions identified. No syrinx. No abnormal leptomeningeal nor epidural enhancement.  Cervical vertebral bodies intact, straightened cervical lordosis. 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 Intervertebral discs demonstrate generally normal morphology, slightly decreased T2 signal consistent with desiccation. Mild subacute discogenic endplate changes C3-4, C5-6 and a lesser extent at C6-7 without STIR signal abnormality to suggest acute osseous process. No abnormal osseous or intradiscal enhancement.  2.3  x 1.9 cm dominant LEFT thyroid nodule.  Level by level evaluation:  C2-3:  No disc bulge, canal stenosis or neural foraminal narrowing.  C3-4: 2 mm broad-based disc bulge, uncovertebral hypertrophy  and mild LEFT facet arthropathy. Mild canal stenosis, moderate to severe LEFT neural foraminal narrowing.  C4-5: 1-2 mm broad-based disc bulge, uncovertebral hypertrophy and mild facet arthropathy. Mild canal stenosis, mild neural foraminal narrowing.  C5-6: 1-2 mm broad-based disc bulge, uncovertebral hypertrophy. Mild canal stenosis. Mild to moderate LEFT neural foraminal narrowing.  C6-7: Annular bulging, uncovertebral hypertrophy without canal stenosis or neural foraminal narrowing.  C7-T1: No disc bulge, canal stenosis nor neural foraminal narrowing.  MRI THORACIC SPINE FINDINGS  Thoracic vertebral bodies and posterior elements intact aligned 1 maintenance of thoracic kyphosis. Mild T7-8 disc height loss, with decreased T2 signal suggesting mild desiccation. Mild acute enhancing discogenic endplate change at T5-6, subacute discogenic endplate changes T7-8. No STIR signal abnormality to suggest fracture. No abnormal osseous or intradiscal enhancement.  Thoracic spinal cord appears normal morphology and signal characteristics the conus medullaris which terminates at T12-L1. No abnormal cord, leptomeningeal or epidural enhancement. Prevertebral and paraspinal soft tissues are normal.  Tiny T5-6 central disc protrusion. 2 mm RIGHT T7 suspected disc extrusion partially characterized due to slice thickness. No canal stenosis or neural foraminal narrowing at any thoracic level.  IMPRESSION: MRI HEAD  Normal MRI of the brain without without contrast.  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 Neural foraminal narrowing C3-4 through C5-6: Moderate to severe on the LEFT at C3-4.  2.3 cm LEFT thyroid nodule for which follow up thyroid sonogram is recommended on a nonemergent basis.  MRI THORACIC  SPINE  No MR findings of demyelination in the thoracic spinal cord.  Mild degenerative change of thoracic spine without canal stenosis or neural foraminal narrowing.   Electronically Signed   By: Awilda Metro   On: 12/04/2014 04:49   Mr Cervical Spine W Wo Contrast  12/04/2014   CLINICAL DATA:  Numbness and tingling in hips progressing to chest bilaterally over 3 weeks.  EXAM: MRI HEAD WITHOUT AND WITH CONTRAST  MRI CERVICAL SPINE WITHOUT AND WITH CONTRAST  MRI THORACIC SPINE WITHOUT AND WITH CONTRAST  TECHNIQUE: Multiplanar, multiecho pulse sequences of the brain and surrounding structures, cervical and thoracic spine, to include the craniocervical junction, were obtained without and with intravenous contrast.  CONTRAST:  20mL MULTIHANCE GADOBENATE DIMEGLUMINE 529 MG/ML IV SOLN chest radiograph November 19, 2014 and lumbar spine radiographs November 19, 2014  COMPARISON:  None.  FINDINGS: MRI HEAD FINDINGS  The ventricles and sulci are normal for patient's age. No abnormal parenchymal signal, mass lesions, mass effect. No abnormal parenchymal enhancement. No reduced diffusion to suggest acute ischemia. No susceptibility artifact to suggest hemorrhage.  No abnormal extra-axial fluid collections. No extra-axial masses nor leptomeningeal enhancement. Normal major intracranial vascular flow voids seen at the skull base.  Ocular globes and orbital contents are unremarkable though not tailored for evaluation. No abnormal sellar expansion. Visualized paranasal sinuses and mastoid air cells are well-aerated. No suspicious calvarial bone marrow signal. No abnormal sellar expansion. Craniocervical junction maintained.  MRI CERVICAL SPINE FINDINGS  Expansile cervical spinal cord T2 bright, T1 hypointense lesion with homogeneous enhancement from C4 through C6, measuring 4.1 cm in craniocaudad dimension, with relative sparing of the  lateral columns. No additional cervical spinal cord lesions identified. No syrinx. No  abnormal leptomeningeal nor epidural enhancement.  Cervical vertebral bodies intact, straightened cervical lordosis. 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 Intervertebral discs demonstrate generally normal morphology, slightly decreased T2 signal consistent with desiccation. Mild subacute discogenic endplate changes C3-4, C5-6 and a lesser extent at C6-7 without STIR signal abnormality to suggest acute osseous process. No abnormal osseous or intradiscal enhancement.  2.3 x 1.9 cm dominant LEFT thyroid nodule.  Level by level evaluation:  C2-3:  No disc bulge, canal stenosis or neural foraminal narrowing.  C3-4: 2 mm broad-based disc bulge, uncovertebral hypertrophy and mild LEFT facet arthropathy. Mild canal stenosis, moderate to severe LEFT neural foraminal narrowing.  C4-5: 1-2 mm broad-based disc bulge, uncovertebral hypertrophy and mild facet arthropathy. Mild canal stenosis, mild neural foraminal narrowing.  C5-6: 1-2 mm broad-based disc bulge, uncovertebral hypertrophy. Mild canal stenosis. Mild to moderate LEFT neural foraminal narrowing.  C6-7: Annular bulging, uncovertebral hypertrophy without canal stenosis or neural foraminal narrowing.  C7-T1: No disc bulge, canal stenosis nor neural foraminal narrowing.  MRI THORACIC SPINE FINDINGS  Thoracic vertebral bodies and posterior elements intact aligned 1 maintenance of thoracic kyphosis. Mild T7-8 disc height loss, with decreased T2 signal suggesting mild desiccation. Mild acute enhancing discogenic endplate change at T5-6, subacute discogenic endplate changes T7-8. No STIR signal abnormality to suggest fracture. No abnormal osseous or intradiscal enhancement.  Thoracic spinal cord appears normal morphology and signal characteristics the conus medullaris which terminates at T12-L1. No abnormal cord, leptomeningeal or epidural enhancement. Prevertebral and paraspinal soft tissues are normal.  Tiny T5-6 central disc protrusion. 2 mm RIGHT T7  suspected disc extrusion partially characterized due to slice thickness. No canal stenosis or neural foraminal narrowing at any thoracic level.  IMPRESSION: MRI HEAD  Normal MRI of the brain without without contrast.  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 Neural foraminal narrowing C3-4 through C5-6: Moderate to severe on the LEFT at C3-4.  2.3 cm LEFT thyroid nodule for which follow up thyroid sonogram is recommended on a nonemergent basis.  MRI THORACIC SPINE  No MR findings of demyelination in the thoracic spinal cord.  Mild degenerative change of thoracic spine without canal stenosis or neural foraminal narrowing.   Electronically Signed   By: Awilda Metro   On: 12/04/2014 04:49   Mr Thoracic Spine W Wo Contrast  12/04/2014   CLINICAL DATA:  Numbness and tingling in hips progressing to chest bilaterally over 3 weeks.  EXAM: MRI HEAD WITHOUT AND WITH CONTRAST  MRI CERVICAL SPINE WITHOUT AND WITH CONTRAST  MRI THORACIC SPINE WITHOUT AND WITH CONTRAST  TECHNIQUE: Multiplanar, multiecho pulse sequences of the brain and surrounding structures, cervical and thoracic spine, to include the craniocervical junction, were obtained without and with intravenous contrast.  CONTRAST:  20mL MULTIHANCE GADOBENATE DIMEGLUMINE 529 MG/ML IV SOLN chest radiograph November 19, 2014 and lumbar spine radiographs November 19, 2014  COMPARISON:  None.  FINDINGS: MRI HEAD FINDINGS  The ventricles and sulci are normal for patient's age. No abnormal parenchymal signal, mass lesions, mass effect. No abnormal parenchymal enhancement. No reduced diffusion to suggest acute ischemia. No susceptibility artifact to suggest hemorrhage.  No abnormal extra-axial fluid collections. No extra-axial masses nor  leptomeningeal enhancement. Normal major intracranial vascular flow voids seen  at the skull base.  Ocular globes and orbital contents are unremarkable though not tailored for evaluation. No abnormal sellar expansion. Visualized paranasal sinuses and mastoid air cells are well-aerated. No suspicious calvarial bone marrow signal. No abnormal sellar expansion. Craniocervical junction maintained.  MRI CERVICAL SPINE FINDINGS  Expansile cervical spinal cord T2 bright, T1 hypointense lesion with homogeneous enhancement from C4 through C6, measuring 4.1 cm in craniocaudad dimension, with relative sparing of the lateral columns. No additional cervical spinal cord lesions identified. No syrinx. No abnormal leptomeningeal nor epidural enhancement.  Cervical vertebral bodies intact, straightened cervical lordosis. 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 Intervertebral discs demonstrate generally normal morphology, slightly decreased T2 signal consistent with desiccation. Mild subacute discogenic endplate changes C3-4, C5-6 and a lesser extent at C6-7 without STIR signal abnormality to suggest acute osseous process. No abnormal osseous or intradiscal enhancement.  2.3 x 1.9 cm dominant LEFT thyroid nodule.  Level by level evaluation:  C2-3:  No disc bulge, canal stenosis or neural foraminal narrowing.  C3-4: 2 mm broad-based disc bulge, uncovertebral hypertrophy and mild LEFT facet arthropathy. Mild canal stenosis, moderate to severe LEFT neural foraminal narrowing.  C4-5: 1-2 mm broad-based disc bulge, uncovertebral hypertrophy and mild facet arthropathy. Mild canal stenosis, mild neural foraminal narrowing.  C5-6: 1-2 mm broad-based disc bulge, uncovertebral hypertrophy. Mild canal stenosis. Mild to moderate LEFT neural foraminal narrowing.  C6-7: Annular bulging, uncovertebral hypertrophy without canal stenosis or neural foraminal narrowing.  C7-T1: No disc bulge, canal stenosis nor neural foraminal narrowing.   MRI THORACIC SPINE FINDINGS  Thoracic vertebral bodies and posterior elements intact aligned 1 maintenance of thoracic kyphosis. Mild T7-8 disc height loss, with decreased T2 signal suggesting mild desiccation. Mild acute enhancing discogenic endplate change at T5-6, subacute discogenic endplate changes T7-8. No STIR signal abnormality to suggest fracture. No abnormal osseous or intradiscal enhancement.  Thoracic spinal cord appears normal morphology and signal characteristics the conus medullaris which terminates at T12-L1. No abnormal cord, leptomeningeal or epidural enhancement. Prevertebral and paraspinal soft tissues are normal.  Tiny T5-6 central disc protrusion. 2 mm RIGHT T7 suspected disc extrusion partially characterized due to slice thickness. No canal stenosis or neural foraminal narrowing at any thoracic level.  IMPRESSION: MRI HEAD  Normal MRI of the brain without without contrast.  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 Neural foraminal narrowing C3-4 through C5-6: Moderate to severe on the LEFT at C3-4.  2.3 cm LEFT thyroid nodule for which follow up thyroid sonogram is recommended on a nonemergent basis.  MRI THORACIC SPINE  No MR findings of demyelination in the thoracic spinal cord.  Mild degenerative change of thoracic spine without canal stenosis or neural foraminal narrowing.   Electronically Signed   By: Awilda Metro   On: 12/04/2014 04:49   US Soft Tissue Head/neck  12/08/2014   CLINICAL DATA:  Left thyroid nodule  EXAM: THYROID ULTRASOUND  TECHNIQUE: Ultrasound examination of the thyroid gland and adjacent soft tissues was performed.  COMPARISON:  None.  FINDINGS: Right thyroid lobe  Measurements: 5.0 x 1.4 x 1.6 cm. 6 mm isoechoic nodule in the lower pole.  Adjacent 9 mm solid nodule in the lower pole.  Left thyroid lobe  Measurements: 5.9 x 2.7 x 1.7 cm. Interpolar region nodule measures 1.7 x 0.9 x 1.4 cm.  Dominant solid left upper pole nodule measures 3.0 x 2.0 x 2.0 cm. It is homogeneous and isoechoic.  Isthmus  Thickness: 3 mm.  No nodules visualized.  Lymphadenopathy  None visualized.  IMPRESSION: Bilateral nodules. Dominant left upper pole nodule measures 3.0 cm. Findings meet consensus criteria for biopsy. Ultrasound-guided fine needle aspiration should be considered, as per the consensus statement: Management of Thyroid Nodules Detected at [place]: Society of Radiologists in Ultrasound Consensus Conference Statement. Radiology 2005; [id number].   Electronically Signed   By: Maryclare Cisneros M.D.   On: 12/08/2014 08:36   Dg Fluoro Guide Ndl Plc/bx  12/05/2014   CLINICAL DATA:  Peripheral neuropathy  EXAM: DIAGNOSTIC LUMBAR PUNCTURE UNDER FLUOROSCOPIC GUIDANCE  FLUOROSCOPY TIME:  30 seconds.  PROCEDURE: Informed consent was obtained from the patient prior to the procedure, including potential complications of headache, allergy, and pain. With the patient prone, the lower back was prepped with Betadine. 1% Lidocaine was used for local anesthesia. Lumbar puncture was performed at the left L2-3 level using a 20 gauge needle with return of clear CSF with an opening pressure of 20 cm water. Tenml of CSF were obtained for laboratory studies. The patient tolerated the procedure well and there were no apparent complications.  COMPLICATIONS: None  IMPRESSION: Successful lumbar puncture for fluid.  Opening depression 20 cm water.  10 cc clear CSF obtained.   Electronically Signed   By: Maryclare Cisneros M.D.   On: 12/05/2014 08:11     Today   Subjective:   Jessica Cisneros today has no headache,no chest abdominal pain,no new weakness tingling or numbness, feels much better wants to go home today.   Objective:   Blood pressure 123/59, pulse 92, temperature 98.2 F (36.8 C),  temperature source Oral, resp. rate 20, height  (1.626 m), weight 136.079 kg (300 lb), last menstrual period 11/26/2014, SpO2 98 %.  Intake/Output Summary (Last 24 hours) at 12/08/14 1025 Last data filed at 12/08/14 1000  Gross per 24 hour  Intake    720 ml  Output      0 ml  Net    720 ml    Exam Awake Alert, Oriented *3, No new F.N deficits, Normal affect [place].AT,PERRAL Supple Neck,No JVD, No cervical lymphadenopathy appriciated.  Symmetrical Chest wall movement, Good air movement bilaterally, CTAB RRR,No Gallops,Rubs or new Murmurs, No Parasternal Heave +ve B.Sounds, Abd Soft, Non tender, No organomegaly appriciated, No rebound -guarding or rigidity. No Cyanosis, Clubbing or edema, No new Rash or bruise  Data Review   Cultures -  Results for orders placed or performed during the hospital encounter of 12/03/14  CSF culture     Status: None   Collection Time: 12/04/14 12:32 PM  Result Value Ref Range Status   Specimen Description CSF  Final   Special Requests CSF FLUID NO2 2.5ML  Final   Gram Stain   Final    CYTOSPIN NO WBC SEEN NO ORGANISMS SEEN Performed at [facility] Performed at [facility]    Culture   Final    NO GROWTH 3 DAYS Performed at [facility]    Report Status 12/08/2014 FINAL  Final  Gram stain     Status: None   Collection Time: 12/04/14 12:32 PM  Result Value Ref Range Status   Specimen Description CSF  Final   Special Requests NONE  Final   Gram Stain   Final    WBC PRESENT, PREDOMINANTLY MONONUCLEAR NO ORGANISMS SEEN CYTOSPIN SLIDE  Report Status 12/04/2014 FINAL  Final  Culture, Urine     Status: None   Collection Time: 12/06/14 11:09 PM  Result Value Ref Range Status   Specimen Description URINE, CLEAN CATCH  Final   Special Requests NONE  Final   Colony Count   Final    85,000 COLONIES/ML Performed at [facility]    Culture   Final    Multiple bacterial morphotypes present, none predominant.  Suggest appropriate recollection if clinically indicated. Performed at [facility]    Report Status 12/08/2014 FINAL  Final     CBC w Diff: Lab Results  Component Value Date   WBC 15.8* 12/07/2014   HGB 10.4* 12/07/2014   HCT 32.4* 12/07/2014   PLT 403* 12/07/2014   LYMPHOPCT 25 12/03/2014   MONOPCT 7 12/03/2014   EOSPCT 1 12/03/2014   BASOPCT 0 12/03/2014   CMP: Lab Results  Component Value Date   NA 141 12/07/2014   K 4.8 12/07/2014   CL 107 12/07/2014   CO2 23 12/07/2014   BUN 17 12/07/2014   CREATININE 0.74 12/07/2014   PROT 6.8 12/03/2014   ALBUMIN 3.6 12/03/2014   BILITOT 0.7 12/03/2014   ALKPHOS 91 12/03/2014   AST 22 12/03/2014   ALT 23 12/03/2014  .  Micro Results Recent Results (from the past 240 hour(s))  CSF culture     Status: None   Collection Time: 12/04/14 12:32 PM  Result Value Ref Range Status   Specimen Description CSF  Final   Special Requests CSF FLUID NO2 2.5ML  Final   Gram Stain   Final    CYTOSPIN NO WBC SEEN NO ORGANISMS SEEN Performed at [facility] Performed at [facility]    Culture   Final    NO GROWTH 3 DAYS Performed at [facility]    Report Status 12/08/2014 FINAL  Final  Gram stain     Status: None   Collection Time: 12/04/14 12:32 PM  Result Value Ref Range Status   Specimen Description CSF  Final   Special Requests NONE  Final   Gram Stain   Final    WBC PRESENT, PREDOMINANTLY MONONUCLEAR NO ORGANISMS SEEN CYTOSPIN SLIDE    Report Status 12/04/2014 FINAL  Final  Culture, Urine     Status: None   Collection Time: 12/06/14 11:09 PM  Result Value Ref Range Status   Specimen Description URINE, CLEAN CATCH  Final   Special Requests NONE  Final   Colony Count   Final    85,000 COLONIES/ML Performed at [facility]    Culture   Final    Multiple bacterial morphotypes present, none predominant. Suggest appropriate recollection if clinically indicated. Performed at [facility]    Report Status 12/08/2014 FINAL  Final     Discharge Instructions      Follow-up Information    Follow up with [facility].   Specialty:  General Practice   Why:  Please follow with PA Clent Ridges, regarding ultrasound-guided thyroid needle biopsy   Contact information:   [address] [place] [address] [phone]       Follow up with [facility], [facility] K, MD. Schedule an appointment as soon as possible for a visit in 1 week.   Specialty:  Neurology   Contact information:   [phone] [facility] [address] [facility] [place] [address] [phone]       Discharge Medications     Medication  List    TAKE these medications        levofloxacin 500 MG tablet  Commonly known as:  LEVAQUIN  Take 1 tablet (500 mg total) by mouth daily.     Vitamin D (Ergocalciferol) 50000 UNITS Caps capsule  Commonly known as:  DRISDOL  Take 1 capsule (50,000 Units total) by mouth every 7 (seven) days.         Total Time in preparing paper work, data evaluation and todays exam - 35 minutes  Dameion Briles M.D on 12/08/2014 at 10:25 AM  [facility] Office  [phone]

## 2014-12-08 NOTE — Progress Notes (Signed)
Physical Therapy Treatment Patient Details Name: Jessica Cisneros MRN: [id number] DOB: Oct 25, 1976 Today's Date: 12/08/2014    History of Present Illness   Pt is a 39 y.o. Female admitted 12/03/14 with ascending parasthesia with c/o numbness and tingling for 3 weeks of her LEs, torso up to chest, and hands. 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 Pt currently receiving solumedrol treatments. (? 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 No longer needs a RW for home due to her improvements. Will continue with stair training as pt has upstairs at home.   Follow Up Recommendations  Home health PT ( however pt may not qualify due to [facility] regulations)     Equipment Recommendations  None recommended by PT    Recommendations for Other Services       Precautions / Restrictions Precautions Precautions: Fall Restrictions Weight Bearing Restrictions: No    Mobility  Bed Mobility                  Transfers Overall transfer level: Modified independent Equipment used: None Transfers: Sit to/from Stand Sit to Stand: Modified independent (Device/Increase time)         General transfer comment: No unsteadiness noted x 3 (beginning and end of session); + use of UEs on armrests  Ambulation/Gait Ambulation/Gait assistance: Supervision Ambulation Distance (Feet): 300 Feet Assistive device: None (PT pushed IV pole) Gait Pattern/deviations: Step-through pattern;Wide base of support     General Gait Details: reports paresthesias still present; walking with more confidence and incr velocity; reports she has walked in halls pushing her IV pole herself   Stairs Stairs: Yes Stairs assistance: Min  guard Stair Management: One rail Left;Step to pattern;Backwards;Forwards Number of Stairs: 1 (~14" high to simulate her step) General stair comments: repeated step ups x 10 each leg (step up fwd, step back down) on 7" step  Wheelchair Mobility    Modified Rankin (Stroke Patients Only)       Balance             Standing balance-Leahy Scale: Good                      Cognition Arousal/Alertness: Awake/alert Behavior During Therapy: WFL for tasks assessed/performed (tearful once family left) Overall Cognitive Status: Within Functional Limits for tasks assessed                      Exercises Other Exercises Other Exercises: see stair training    General Comments        Pertinent Vitals/Pain Pain Assessment: No/denies pain    Home Living                      Prior Function            PT Goals (current goals can now be found in the care plan section) Acute Rehab PT Goals Patient Stated Goal: to feel normal again Time For Goal Achievement: 12/14/14 (noted evaluating PT entered incorrect date) Progress towards PT goals: Progressing toward goals    Frequency  Min 3X/week    PT Plan Current plan remains appropriate    Co-evaluation  End of Session Equipment Utilized During Treatment: Gait belt Activity Tolerance: Patient tolerated treatment well Patient left: in chair;with call bell/phone within reach;with family/visitor present     Time: 1610-9604 PT Time Calculation (min) (ACUTE ONLY): 25 min  Charges:  $Gait Training: 23-37 mins                    G Codes:      Jessica Cisneros 12/15/2014, 1:44 PM Pager [phone]

## 2014-12-08 NOTE — Sedation Documentation (Signed)
Patient denies pain and is resting comfortably.  

## 2014-12-08 NOTE — Sedation Documentation (Signed)
No further PVCs noted after advancement of guidewire earlier.

## 2014-12-08 NOTE — Consult Note (Signed)
Chief Complaint: Chief Complaint  Patient presents with  . Numbness  tingling from hips to chest   Referring Physician(s): Dr Kirkpatrick  History of Present Illness: Jessica Cisneros is a 38 y.o. 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 Obesity     Past Surgical History  Procedure Laterality Date  . Eye surgery    . Dilation and curettage of uterus      Allergies: Review of patient's allergies indicates no known allergies.  Medications: Prior to Admission medications   Medication Sig Start Date End Date Taking? Authorizing Provider  levofloxacin (LEVAQUIN) 500 MG tablet Take 1 tablet (500 mg total) by mouth daily. 12/08/14   Dawood Elgergawy, MD  Vitamin D, Ergocalciferol, (DRISDOL) 50000 UNITS CAPS capsule Take 1 capsule (50,000 Units total) by mouth every 7 (seven) days. 12/08/14   Dawood Elgergawy, MD    History reviewed. No pertinent family history.  History   Social History  . Marital Status: Married    Spouse Name: N/A    Number of Children: N/A  . Years of Education: N/A   Social History Main Topics  . Smoking status: Never Smoker   . Smokeless tobacco: None  . Alcohol Use: No  . Drug Use: No  . Sexual Activity: None   Other Topics Concern  . None   Social History Narrative     Review of Systems: A 12 point ROS discussed and pertinent positives are indicated in the HPI above.  All other systems are negative.  Review of Systems  Constitutional: Positive for activity change and fatigue. Negative for fever and appetite change.  Respiratory: Negative for chest tightness and shortness of breath.   Cardiovascular: Negative for chest  pain.       Chest and body numbness/tingling  Genitourinary:       Incontinence  Musculoskeletal: Negative for gait problem, neck pain and neck stiffness.  Psychiatric/Behavioral: Negative for behavioral problems and confusion.    Vital Signs: BP 123/59 mmHg  Pulse 92  Temp(Src) 98.2 F (36.8 C) (Oral)  Resp 20  Ht 5\' 4"  (1.626 m)  Wt 136.079 kg (300 lb)  BMI 51.47 kg/m2  SpO2 98%  LMP 11/26/2014  Physical Exam  Constitutional: She is oriented to person, place, and time. She appears well-developed.  obese  Eyes: EOM are normal.  Neck: Normal range of motion.  Cardiovascular: Normal rate and regular rhythm.   No murmur heard. Pulmonary/Chest: Effort normal and breath sounds normal. She has no wheezes.  Abdominal: Soft. Bowel sounds are normal. She exhibits distension.  Musculoskeletal: Normal range of motion.  Neurological: She is alert and oriented to person, place, and time.  Skin: Skin is warm and dry.  Psychiatric: She has a normal mood and affect. Her behavior is normal. Judgment and thought content normal.  Nursing note and vitals reviewed.   Imaging: Ct Chest W Contrast  12/08/2014   CLINICAL DATA:  Evaluate for lymphadenopathy, to assess for possibility of sarcoidosis. Initial encounter.  EXAM: CT CHEST WITH CONTRAST  TECHNIQUE: Multidetector CT imaging of the chest was performed during intravenous contrast administration.  CONTRAST:  <MEASUREMEDomi[facility]802<MEASUREMENTDomi[facility](513<MEASUREMENTDomi[facility]313<MEASUREMENTDomi[facility]413<MEASUREMENTDomi[facility]818<MEASUREMENTDomi[facility]726<MEASUREMENTDomi[facility]410<MEASUREMENTDomi[facility]337<MEASUREMENTDomi[facility]276<MEASUREMENTDomi[facility](352)<MEASUREMENTDomi[facility]626<MEASUREMENTDomi[facility](619)<MEASUREMENTDomi[facility](272<MEASUREMENTDomi[facility](661<MEASUREMENTDomi[facility]423<MEASUREMENTDomi[facility]769<MEASUREMENTDomi[facility](435<MEASUREMENTDomi[facility]4<MEASUREMENTDomi[phone] 300 MG/ML  SOLN  COMPARISON:  Thoracic spine MRI performed 12/04/2014  FINDINGS: Mild bilateral dependent subsegmental atelectasis is noted. The lungs are otherwise clear. There is no evidence of focal airspace consolidation, pleural effusion or pneumothorax. No masses are seen.  The mediastinum is unremarkable in appearance. No mediastinal lymphadenopathy is seen. No pericardial effusion is identified. The great vessels are grossly unremarkable. The patient's right PICC is noted ending about the distal SVC.  A 2.3 cm  heterogeneous mass is again seen arising at the posterior aspect of the left thyroid lobe, similar in appearance to the prior MRI. No axillary lymphadenopathy is seen. Visualized axillary nodes remain normal in size.  The visualized portions of the liver and spleen are unremarkable. The visualized portions of the adrenal glands and pancreas are within normal limits.  No acute osseous abnormalities are identified.  IMPRESSION: 1. No evidence of lymphadenopathy within the chest. 2. Mild bilateral dependent subsegmental atelectasis noted; lungs otherwise clear. 3. 2.3 cm heterogeneous mass again noted arising at the posterior aspect of the left thyroid lobe, as on the recent MRI. Recommend further evaluation with thyroid ultrasound. If patient is clinically hyperthyroid, consider nuclear medicine thyroid uptake and scan.   Electronically Signed   By: Roanna Raider M.D.   On: 12/08/2014 03:10   Mr Laqueta Jean ZO Contrast  12/04/2014   CLINICAL DATA:  Numbness and tingling in hips progressing to chest bilaterally over 3 weeks.  EXAM: MRI HEAD WITHOUT AND WITH CONTRAST  MRI CERVICAL SPINE WITHOUT AND WITH CONTRAST  MRI THORACIC SPINE WITHOUT AND WITH CONTRAST  TECHNIQUE: Multiplanar, multiecho pulse sequences of the brain and surrounding structures, cervical and thoracic spine, to include the craniocervical junction, were obtained without and with intravenous contrast.  CONTRAST:  20mL MULTIHANCE GADOBENATE DIMEGLUMINE 529 MG/ML IV SOLN chest radiograph November 19, 2014 and lumbar spine radiographs November 19, 2014  COMPARISON:  None.  FINDINGS: MRI HEAD FINDINGS  The ventricles and sulci are normal for patient's age. No abnormal parenchymal signal, mass lesions, mass effect. No abnormal parenchymal enhancement. No reduced diffusion to suggest acute ischemia. No susceptibility artifact to suggest hemorrhage.  No abnormal extra-axial fluid collections. No extra-axial masses nor leptomeningeal enhancement. Normal major  intracranial vascular flow voids seen at the skull base.  Ocular globes and orbital contents are unremarkable though not tailored for evaluation. No abnormal sellar expansion. Visualized paranasal sinuses and mastoid air cells are well-aerated. No suspicious calvarial bone marrow signal. No abnormal sellar expansion. Craniocervical junction maintained.  MRI CERVICAL SPINE FINDINGS  Expansile cervical spinal cord T2 bright, T1 hypointense lesion with homogeneous enhancement from C4 through C6, measuring 4.1 cm in craniocaudad dimension, with relative sparing of the lateral columns. No additional cervical spinal cord lesions identified. No syrinx. No abnormal leptomeningeal nor epidural enhancement.  Cervical vertebral bodies intact, straightened cervical lordosis. 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 Intervertebral discs demonstrate generally normal morphology, slightly decreased T2 signal consistent with desiccation. Mild subacute discogenic endplate changes C3-4, C5-6 and a lesser extent at C6-7 without STIR signal abnormality to suggest acute osseous process. No abnormal osseous or intradiscal enhancement.  2.3 x 1.9 cm dominant LEFT thyroid nodule.  Level by level evaluation:  C2-3:  No disc bulge, canal stenosis or neural foraminal narrowing.  C3-4: 2 mm broad-based disc bulge, uncovertebral hypertrophy and mild LEFT facet arthropathy. Mild canal stenosis, moderate to severe LEFT neural foraminal narrowing.  C4-5: 1-2 mm broad-based disc bulge, uncovertebral hypertrophy and mild facet arthropathy. Mild canal stenosis, mild neural foraminal  narrowing.  C5-6: 1-2 mm broad-based disc bulge, uncovertebral hypertrophy. Mild canal stenosis. Mild to moderate LEFT neural foraminal narrowing.  C6-7: Annular bulging, uncovertebral hypertrophy without canal stenosis or neural foraminal narrowing.  C7-T1: No disc bulge, canal stenosis nor neural foraminal narrowing.  MRI THORACIC SPINE FINDINGS  Thoracic  vertebral bodies and posterior elements intact aligned 1 maintenance of thoracic kyphosis. Mild T7-8 disc height loss, with decreased T2 signal suggesting mild desiccation. Mild acute enhancing discogenic endplate change at T5-6, subacute discogenic endplate changes T7-8. No STIR signal abnormality to suggest fracture. No abnormal osseous or intradiscal enhancement.  Thoracic spinal cord appears normal morphology and signal characteristics the conus medullaris which terminates at T12-L1. No abnormal cord, leptomeningeal or epidural enhancement. Prevertebral and paraspinal soft tissues are normal.  Tiny T5-6 central disc protrusion. 2 mm RIGHT T7 suspected disc extrusion partially characterized due to slice thickness. No canal stenosis or neural foraminal narrowing at any thoracic level.  IMPRESSION: MRI HEAD  Normal MRI of the brain without without contrast.  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 Neural foraminal narrowing C3-4 through C5-6: Moderate to severe on the LEFT at C3-4.  2.3 cm LEFT thyroid nodule for which follow up thyroid sonogram is recommended on a nonemergent basis.  MRI THORACIC SPINE  No MR findings of demyelination in the thoracic spinal cord.  Mild degenerative change of thoracic spine without canal stenosis or neural foraminal narrowing.   Electronically Signed   By: Awilda Metro   On: 12/04/2014 04:49   Mr Cervical Spine W Wo Contrast  12/04/2014   CLINICAL DATA:  Numbness and tingling in hips progressing to chest bilaterally over 3 weeks.  EXAM: MRI HEAD WITHOUT AND WITH CONTRAST  MRI CERVICAL SPINE WITHOUT AND WITH CONTRAST  MRI THORACIC SPINE WITHOUT AND WITH CONTRAST  TECHNIQUE: Multiplanar, multiecho pulse sequences of the brain and surrounding  structures, cervical and thoracic spine, to include the craniocervical junction, were obtained without and with intravenous contrast.  CONTRAST:  20mL MULTIHANCE GADOBENATE DIMEGLUMINE 529 MG/ML IV SOLN chest radiograph November 19, 2014 and lumbar spine radiographs November 19, 2014  COMPARISON:  None.  FINDINGS: MRI HEAD FINDINGS  The ventricles and sulci are normal for patient's age. No abnormal parenchymal signal, mass lesions, mass effect. No abnormal parenchymal enhancement. No reduced diffusion to suggest acute ischemia. No susceptibility artifact to suggest hemorrhage.  No abnormal extra-axial fluid collections. No extra-axial masses nor leptomeningeal enhancement. Normal major intracranial vascular flow voids seen at the skull base.  Ocular globes and orbital contents are unremarkable though not tailored for evaluation. No abnormal sellar expansion. Visualized paranasal sinuses and mastoid air cells are well-aerated. No suspicious calvarial bone marrow signal. No abnormal sellar expansion. Craniocervical junction maintained.  MRI CERVICAL SPINE FINDINGS  Expansile cervical spinal cord T2 bright, T1 hypointense lesion with homogeneous enhancement from C4 through C6, measuring 4.1 cm in craniocaudad dimension, with relative sparing of the lateral columns. No additional cervical spinal cord lesions identified. No syrinx. No abnormal leptomeningeal nor epidural enhancement.  Cervical vertebral bodies intact, straightened cervical lordosis. 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 Intervertebral discs demonstrate generally normal morphology, slightly decreased T2 signal consistent with desiccation. Mild subacute discogenic endplate changes C3-4, C5-6 and a lesser extent at C6-7 without STIR signal abnormality to suggest acute osseous process.  No abnormal osseous or intradiscal enhancement.  2.3 x 1.9 cm dominant LEFT thyroid nodule.  Level by level evaluation:  C2-3:  No disc bulge, canal stenosis or  neural foraminal narrowing.  C3-4: 2 mm broad-based disc bulge, uncovertebral hypertrophy and mild LEFT facet arthropathy. Mild canal stenosis, moderate to severe LEFT neural foraminal narrowing.  C4-5: 1-2 mm broad-based disc bulge, uncovertebral hypertrophy and mild facet arthropathy. Mild canal stenosis, mild neural foraminal narrowing.  C5-6: 1-2 mm broad-based disc bulge, uncovertebral hypertrophy. Mild canal stenosis. Mild to moderate LEFT neural foraminal narrowing.  C6-7: Annular bulging, uncovertebral hypertrophy without canal stenosis or neural foraminal narrowing.  C7-T1: No disc bulge, canal stenosis nor neural foraminal narrowing.  MRI THORACIC SPINE FINDINGS  Thoracic vertebral bodies and posterior elements intact aligned 1 maintenance of thoracic kyphosis. Mild T7-8 disc height loss, with decreased T2 signal suggesting mild desiccation. Mild acute enhancing discogenic endplate change at T5-6, subacute discogenic endplate changes T7-8. No STIR signal abnormality to suggest fracture. No abnormal osseous or intradiscal enhancement.  Thoracic spinal cord appears normal morphology and signal characteristics the conus medullaris which terminates at T12-L1. No abnormal cord, leptomeningeal or epidural enhancement. Prevertebral and paraspinal soft tissues are normal.  Tiny T5-6 central disc protrusion. 2 mm RIGHT T7 suspected disc extrusion partially characterized due to slice thickness. No canal stenosis or neural foraminal narrowing at any thoracic level.  IMPRESSION: MRI HEAD  Normal MRI of the brain without without contrast.  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 Neural foraminal narrowing C3-4 through C5-6: Moderate to severe on the LEFT at C3-4.  2.3 cm LEFT thyroid nodule for  which follow up thyroid sonogram is recommended on a nonemergent basis.  MRI THORACIC SPINE  No MR findings of demyelination in the thoracic spinal cord.  Mild degenerative change of thoracic spine without canal stenosis or neural foraminal narrowing.   Electronically Signed   By: Awilda Metro   On: 12/04/2014 04:49   Mr Thoracic Spine W Wo Contrast  12/04/2014   CLINICAL DATA:  Numbness and tingling in hips progressing to chest bilaterally over 3 weeks.  EXAM: MRI HEAD WITHOUT AND WITH CONTRAST  MRI CERVICAL SPINE WITHOUT AND WITH CONTRAST  MRI THORACIC SPINE WITHOUT AND WITH CONTRAST  TECHNIQUE: Multiplanar, multiecho pulse sequences of the brain and surrounding structures, cervical and thoracic spine, to include the craniocervical junction, were obtained without and with intravenous contrast.  CONTRAST:  69mL MULTIHANCE GADOBENATE DIMEGLUMINE 529 MG/ML IV SOLN chest radiograph November 19, 2014 and lumbar spine radiographs November 19, 2014  COMPARISON:  None.  FINDINGS: MRI HEAD FINDINGS  The ventricles and sulci are normal for patient's age. No abnormal parenchymal signal, mass lesions, mass effect. No abnormal parenchymal enhancement. No reduced diffusion to suggest acute ischemia. No susceptibility artifact to suggest hemorrhage.  No abnormal extra-axial fluid collections. No extra-axial masses nor leptomeningeal enhancement. Normal major intracranial vascular flow voids seen at the skull base.  Ocular globes and orbital contents are unremarkable though not tailored for evaluation. No abnormal sellar expansion. Visualized paranasal sinuses and mastoid air cells are well-aerated. No suspicious calvarial bone marrow signal. No abnormal sellar expansion. Craniocervical junction maintained.  MRI CERVICAL SPINE FINDINGS  Expansile cervical spinal cord T2 bright, T1 hypointense lesion with homogeneous enhancement from C4 through C6, measuring 4.1 cm  in craniocaudad dimension, with relative sparing of the  lateral columns. No additional cervical spinal cord lesions identified. No syrinx. No abnormal leptomeningeal nor epidural enhancement.  Cervical vertebral bodies intact, straightened cervical lordosis. 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 Intervertebral discs demonstrate generally normal morphology, slightly decreased T2 signal consistent with desiccation. Mild subacute discogenic endplate changes C3-4, C5-6 and a lesser extent at C6-7 without STIR signal abnormality to suggest acute osseous process. No abnormal osseous or intradiscal enhancement.  2.3 x 1.9 cm dominant LEFT thyroid nodule.  Level by level evaluation:  C2-3:  No disc bulge, canal stenosis or neural foraminal narrowing.  C3-4: 2 mm broad-based disc bulge, uncovertebral hypertrophy and mild LEFT facet arthropathy. Mild canal stenosis, moderate to severe LEFT neural foraminal narrowing.  C4-5: 1-2 mm broad-based disc bulge, uncovertebral hypertrophy and mild facet arthropathy. Mild canal stenosis, mild neural foraminal narrowing.  C5-6: 1-2 mm broad-based disc bulge, uncovertebral hypertrophy. Mild canal stenosis. Mild to moderate LEFT neural foraminal narrowing.  C6-7: Annular bulging, uncovertebral hypertrophy without canal stenosis or neural foraminal narrowing.  C7-T1: No disc bulge, canal stenosis nor neural foraminal narrowing.  MRI THORACIC SPINE FINDINGS  Thoracic vertebral bodies and posterior elements intact aligned 1 maintenance of thoracic kyphosis. Mild T7-8 disc height loss, with decreased T2 signal suggesting mild desiccation. Mild acute enhancing discogenic endplate change at T5-6, subacute discogenic endplate changes T7-8. No STIR signal abnormality to suggest fracture. No abnormal osseous or intradiscal enhancement.  Thoracic spinal cord appears normal morphology and signal characteristics the conus medullaris which terminates at T12-L1. No abnormal cord, leptomeningeal or epidural enhancement. Prevertebral and  paraspinal soft tissues are normal.  Tiny T5-6 central disc protrusion. 2 mm RIGHT T7 suspected disc extrusion partially characterized due to slice thickness. No canal stenosis or neural foraminal narrowing at any thoracic level.  IMPRESSION: MRI HEAD  Normal MRI of the brain without without contrast.  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 Neural foraminal narrowing C3-4 through C5-6: Moderate to severe on the LEFT at C3-4.  2.3 cm LEFT thyroid nodule for which follow up thyroid sonogram is recommended on a nonemergent basis.  MRI THORACIC SPINE  No MR findings of demyelination in the thoracic spinal cord.  Mild degenerative change of thoracic spine without canal stenosis or neural foraminal narrowing.   Electronically Signed   By: Awilda Metro   On: 12/04/2014 04:49   US Soft Tissue Head/neck  12/08/2014   CLINICAL DATA:  Left thyroid nodule  EXAM: THYROID ULTRASOUND  TECHNIQUE: Ultrasound examination of the thyroid gland and adjacent soft tissues was performed.  COMPARISON:  None.  FINDINGS: Right thyroid lobe  Measurements: 5.0 x 1.4 x 1.6 cm. 6 mm isoechoic nodule in the lower pole. Adjacent 9 mm solid nodule in the lower pole.  Left thyroid lobe  Measurements: 5.9 x 2.7 x 1.7 cm. Interpolar region nodule measures 1.7 x 0.9 x 1.4 cm. Dominant solid left upper pole nodule measures 3.0 x 2.0 x 2.0 cm. It is homogeneous and isoechoic.  Isthmus  Thickness: 3 mm.  No nodules visualized.  Lymphadenopathy  None visualized.  IMPRESSION: Bilateral nodules. Dominant left upper pole nodule measures 3.0 cm. Findings meet consensus criteria for biopsy. Ultrasound-guided fine needle aspiration should be considered, as per the consensus statement: Management of Thyroid Nodules Detected  at [place]:  Society of Radiologists in Ultrasound [employer]. Radiology 2005; [id number].   Electronically Signed   By: Maryclare Bean M.D.   On: 12/08/2014 08:36   Dg Fluoro Guide Ndl Plc/bx  12/05/2014   CLINICAL DATA:  Peripheral neuropathy  EXAM: DIAGNOSTIC LUMBAR PUNCTURE UNDER FLUOROSCOPIC GUIDANCE  FLUOROSCOPY TIME:  30 seconds.  PROCEDURE: Informed consent was obtained from the patient prior to the procedure, including potential complications of headache, allergy, and pain. With the patient prone, the lower back was prepped with Betadine. 1% Lidocaine was used for local anesthesia. Lumbar puncture was performed at the left L2-3 level using a 20 gauge needle with return of clear CSF with an opening pressure of 20 cm water. Tenml of CSF were obtained for laboratory studies. The patient tolerated the procedure well and there were no apparent complications.  COMPLICATIONS: None  IMPRESSION: Successful lumbar puncture for fluid.  Opening depression 20 cm water.  10 cc clear CSF obtained.   Electronically Signed   By: Maryclare Bean M.D.   On: 12/05/2014 08:11    Labs:  CBC:  Recent Labs  12/04/14 0720 12/05/14 0446 12/06/14 0414 12/07/14 0445  WBC 9.0 9.6 18.8* 15.8*  HGB 10.3* 11.3* 10.3* 10.4*  HCT 33.1* 36.4 33.7* 32.4*  PLT 345 407* 415* 403*    COAGS: No results for input(s): INR, APTT in the last 8760 hours.  BMP:  Recent Labs  12/03/14 2133 12/04/14 0720 12/05/14 0446 12/06/14 0414 12/07/14 0445  NA 139  --  138 138 141  K 4.0  --  4.0 4.2 4.8  CL 104  --  107 106 107  CO2 28  --  23 23 23   GLUCOSE 137*  --  185* 170* 147*  BUN 8  --  8 11 17   CALCIUM 9.1  --  9.2 9.2 9.1  CREATININE 0.85 0.81 0.80 0.73 0.74  GFRNONAA 85* >90 >90 >90 >90  GFRAA >90 >90 >90 >90 >90    LIVER FUNCTION TESTS:  Recent Labs  12/03/14 2133  BILITOT 0.7  AST 22  ALT 23  ALKPHOS 91  PROT 6.8  ALBUMIN 3.6    TUMOR MARKERS: No results for input(s): AFPTM, CEA, CA199,  CHROMGRNA in the last 8760 hours.  Assessment and Plan:  Transverse Myelitis Not responding well to IV steroids Now for plasma pheresis per Dr Amada Jupiter Pt and family aware of procedure benefits and risks including but not limited to: Infection; vessel damage Agreeable to proceed Consent signed andin chart   Thank you for this interesting consult.  I greatly enjoyed meeting Jessica Cisneros and look forward to participating in their care.  Signed: Phu Record A 12/08/2014, 12:24 PM   I spent a total of 20inutes face to face in clinical consultation, greater than 50% of which was counseling/coordinating care for tunneled plasma pheresis catheter placement

## 2014-12-08 NOTE — Progress Notes (Signed)
Subjective: No improvement, having incontinence  Exam: Filed Vitals:   12/08/14 0959  BP: 123/59  Pulse: 92  Temp: 98.2 F (36.8 C)  Resp: 20   Gen: In bed, NAD MS: awake, alert, interactive and appropriate OH:YWVPX, EOMI Motor: 5/5 Sensory: decreased to temp in the arms and legs.    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 Workup is ongoing and she is receiving IV steroids day #5. With no response to IV steroids, it would be reasonable at this time to pursue plasma exchange.   Recommendations: 1) Will add CT chest, ACE to eval for sarcoid.  2) will add serum NMO in addition to CSF NMO.  3) PLEX, will do 3 tx daily, then QOD for last two.   Ritta Slot, MD [facility] [phone]  If 7pm- 7am, please page neurology on call as listed in AMION.

## 2014-12-08 NOTE — Progress Notes (Signed)
UR complete.  Huma Imhoff RN, MSN 

## 2014-12-08 NOTE — Sedation Documentation (Signed)
Patient is resting comfortably. 

## 2014-12-08 NOTE — Progress Notes (Signed)
  RD consulted for nutrition education regarding Morbid Obesity.  Body mass index is 51.47 kg/(m^2). Pt meets criteria for Morbid Obesity based on current BMI.  RD provided and discussed "General Healthful Nutrition Therapy" and "Weight Loss Tips" handout from the Academy of Nutrition and Dietetics. Emphasized the importance of serving sizes and provided examples of correct portions of common foods. Discussed importance of controlled and consistent intake throughout the day. Provided examples of ways to balance meals/snacks and encouraged intake of high-fiber, whole grain complex carbohydrates. Encouraged intake of fruits and vegetables at every meals. Emphasized the importance of hydration with calorie-free beverages and limiting sugar-sweetened beverages. Encouraged pt to discuss physical activity options with physician. Teach back method used.  Expect good compliance. Pt reports that she plans to drink mostly water and expresses a desire to eat more fruits and vegetables via smoothies and salads.  Pt reports limited income and concern about affording healthy foods with food stamps, RD assisted patient in creating a grocery list with healthful, low-cost foods. Also provided "1800 Calorie 5-Day Sample Menus".   Current diet order is Regular, patient is consuming approximately 100% of meals at this time. Labs and medications reviewed. No further nutrition interventions warranted at this time. RD contact information provided. If additional nutrition issues arise, please re-consult RD.  Ian Malkin RD, LDN Inpatient Clinical Dietitian Pager: [phone] After Hours Pager: [phone]

## 2014-12-08 NOTE — Sedation Documentation (Signed)
Dr. Fredia Sorrow suturing line in place. Pt responds easily to questions, stated she is fine

## 2014-12-08 NOTE — Procedures (Signed)
Procedure:  Tunneled right IJ pheresis catheter placement Findings:  Bard Hemosplit cath measuring 23 cm from tip to cuff placed via right IJ vein.  Cath tip in RA.  No PTX.  OK to use.

## 2014-12-08 NOTE — Progress Notes (Signed)
Patient Demographics  Jessica Cisneros, is a 39 y.o. female, DOB - [birth year], ZOX:[id number]  Admit date - 12/03/2014   Admitting Physician Houston Siren, MD  Outpatient Primary MD for the patient is Pcp Not In System  LOS - 5   Chief Complaint  Patient presents with  . Numbness       Admission history of present illness/brief narrative:  Jessica Cisneros is an 39 y.o. female  presents to the ER complaining of feeling numbness and tingling starting on her hip and progressing cephalad and now on her chest. She denied weakness, pain, blurry vision, stiffneck, fever, or chills. She has some HA, but no stiffneck. She denied any rash.  Evaluation in the ER showed normal serology. Neurology was consulted, recommended hospitalist to admit for further work up to include imaging. Her mother has Multiple Sclerosis. She has no weakness nor pain. 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   Subjective:   Jessica Cisneros today has, No headache, No chest pain, No abdominal pain - No Nausea, reports improvement of her lower extremity tingling or numbness. No Cough - SOB.   Assessment & Plan     Active Problems:   Paresthesia   PCOS (polycystic ovarian syndrome)   Obesity   Low serum vitamin D   Transverse myelitis  Paresthesia/transverse myelitis: - MRI showing evidence of acute demyelination in C4-C6 . - Neurology on board,  -LP showing normal protein and glucose level and normal cell count. - Started on IV Solu Medrol 1 g daily , to finish total of 5 days . - TSH and B-12 within normal limits, RPR nonreactive, ANA negative, HIV nonreactive, Lyme titer negative, - Low vitamin D level, started on vitamin D supplement. - PT/OT consult, out of bed to chair. - Given no improvement of patient's symptoms, she will be started on plasmapheresis as per neurology recommendation, IR consulted for access.  Low vitamin D - cont with supplement.  Leukocytosis - This is most likely related to her steroids  Bacteriuria, symptomatic - on IV Rocephin for UTI treatment 1/25 -- Urine culture growing multiple bacteria, not much assistance.  Morbid obesity - BMI of 51 - Dietitian input appreciated  Incidental finding of thyroid nodule - Patient will need ultrasound-guided thyroid fine-needle biopsy as an outpatient, discussed with [facility] Clent Ridges (patient's primary M.D. [place]) - TSH within normal limits, follow on free T4 level.     Code Status: Full  Family Communication: Family at bedside  Disposition Plan: Home once stable   Procedures  LP 1/22   Consults   Neurology Interventional  radiology   Medications  Scheduled Meds: . therapeutic plasma exchange solution   Dialysis Q1 Hr x 3  . [START ON 12/09/2014]  ceFAZolin (ANCEF) IV  3 g Intravenous 60 min Pre-Op  . cefTRIAXone (ROCEPHIN)  IV  2 g Intravenous Q24H  . heparin subcutaneous  5,000 Units Subcutaneous 3 times per day  . methylPREDNISolone (SOLU-MEDROL) injection  1,000 mg Intravenous Daily  . nystatin   Topical BID  . pantoprazole  40 mg Oral Daily  . Vitamin D (Ergocalciferol)   50,000 Units Oral Q7 days   Continuous Infusions:  PRN Meds:.  DVT Prophylaxis  subcutaneous heparin  Lab Results  Component Value Date   PLT 403* 12/07/2014    Antibiotics    Anti-infectives    Start     Dose/Rate Route Frequency Ordered Stop   12/09/14 0800  ceFAZolin (ANCEF) 3 g in dextrose 5 % 50 mL IVPB    Comments:  Hang on call to IR 1/26   3 g160 mL/hr over 30 Minutes Intravenous 60 min pre-op 12/08/14 1238     12/08/14 0000  levofloxacin (LEVAQUIN) 500 MG tablet     500 mg Oral Daily 12/08/14 1024     12/07/14 0200  cefTRIAXone (ROCEPHIN) 2 g in dextrose 5 % 50 mL IVPB - Premix     2 g100 mL/hr over 30 Minutes Intravenous Every 24 hours 12/07/14 0156            Objective:   Filed Vitals:   12/07/14 2142 12/08/14 0137 12/08/14 0544 12/08/14 0959  BP: 113/57 116/58 130/71 123/59  Pulse: 78 73 70 92  Temp: 98 F (36.7 C) 98.3 F (36.8 C) 98 F (36.7 C) 98.2 F (36.8 C)  TempSrc: Oral Oral Oral Oral  Resp: 18 16 16 20   Height:      Weight:      SpO2: 99% 97% 97% 98%    Wt Readings from Last 3 Encounters:  12/03/14 136.079 kg (300 lb)     Intake/Output Summary (Last 24 hours) at 12/08/14 1338 Last data filed at 12/08/14 1000  Gross per 24 hour  Intake    720 ml  Output      0 ml  Net    720 ml     Physical Exam  Awake Alert, Oriented X 3, No new F.N deficits, Normal affect [place].AT,PERRAL Supple Neck,No JVD, No cervical lymphadenopathy appriciated.  Symmetrical Chest wall movement, Good air movement bilaterally, CTAB RRR,No Gallops,Rubs or new Murmurs, No Parasternal Heave +ve B.Sounds, Abd Soft, No tenderness, No organomegaly appriciated, No rebound - guarding or rigidity. No Cyanosis, Clubbing or edema, No new Rash or bruise     Data Review   Micro Results Recent Results (from the past 240 hour(s))  CSF culture     Status: None   Collection Time: 12/04/14 12:32 PM  Result Value Ref Range Status   Specimen Description CSF  Final    Special Requests CSF FLUID NO2 2.5ML  Final   Gram Stain   Final    CYTOSPIN NO WBC SEEN NO ORGANISMS SEEN Performed at [facility] Performed at [facility]    Culture   Final    NO GROWTH 3 DAYS Performed at [facility]    Report Status 12/08/2014 FINAL  Final  Gram stain     Status: None   Collection Time: 12/04/14 12:32 PM  Result Value Ref Range Status   Specimen Description CSF  Final   Special  Requests NONE  Final   Gram Stain   Final    WBC PRESENT, PREDOMINANTLY MONONUCLEAR NO ORGANISMS SEEN CYTOSPIN SLIDE    Report Status 12/04/2014 FINAL  Final  Culture, Urine     Status: None   Collection Time: 12/06/14 11:09 PM  Result Value Ref Range Status   Specimen Description URINE, CLEAN CATCH  Final   Special Requests NONE  Final   Colony Count   Final    85,000 COLONIES/ML Performed at [facility]    Culture   Final    Multiple bacterial morphotypes present, none predominant. Suggest appropriate recollection if clinically indicated. Performed at [facility]    Report Status 12/08/2014 FINAL  Final    Radiology Reports Ct Chest W Contrast  12/08/2014   CLINICAL DATA:  Evaluate for lymphadenopathy, to assess for possibility of sarcoidosis. Initial encounter.  EXAM: CT CHEST WITH CONTRAST  TECHNIQUE: Multidetector CT imaging of the chest was performed during intravenous contrast administration.  CONTRAST:  OMNIPAQUE IOHEXOL 300 MG/ML  SOLN  COMPARISON:  Thoracic spine MRI performed 12/04/2014  FINDINGS: Mild bilateral dependent subsegmental atelectasis is noted. The lungs are otherwise clear. There is no evidence of focal airspace consolidation, pleural effusion or pneumothorax. No masses are seen.  The mediastinum is unremarkable in appearance. No mediastinal lymphadenopathy is seen. No pericardial effusion is identified. The great vessels are grossly unremarkable. The patient's right PICC is noted ending about the distal  SVC.  A 2.3 cm heterogeneous mass is again seen arising at the posterior aspect of the left thyroid lobe, similar in appearance to the prior MRI. No axillary lymphadenopathy is seen. Visualized axillary nodes remain normal in size.  The visualized portions of the liver and spleen are unremarkable. The visualized portions of the adrenal glands and pancreas are within normal limits.  No acute osseous abnormalities are identified.  IMPRESSION: 1. No evidence of lymphadenopathy within the chest. 2. Mild bilateral dependent subsegmental atelectasis noted; lungs otherwise clear. 3. 2.3 cm heterogeneous mass again noted arising at the posterior aspect of the left thyroid lobe, as on the recent MRI. Recommend further evaluation with thyroid ultrasound. If patient is clinically hyperthyroid, consider nuclear medicine thyroid uptake and scan.   Electronically Signed   By: Roanna Raider M.D.   On: 12/08/2014 03:10   US Soft Tissue Head/neck  12/08/2014   CLINICAL DATA:  Left thyroid nodule  EXAM: THYROID ULTRASOUND  TECHNIQUE: Ultrasound examination of the thyroid gland and adjacent soft tissues was performed.  COMPARISON:  None.  FINDINGS: Right thyroid lobe  Measurements: 5.0 x 1.4 x 1.6 cm. 6 mm isoechoic nodule in the lower pole. Adjacent 9 mm solid nodule in the lower pole.  Left thyroid lobe  Measurements: 5.9 x 2.7 x 1.7 cm. Interpolar region nodule measures 1.7 x 0.9 x 1.4 cm. Dominant solid left upper pole nodule measures 3.0 x 2.0 x 2.0 cm. It is homogeneous and isoechoic.  Isthmus  Thickness: 3 mm.  No nodules visualized.  Lymphadenopathy  None visualized.  IMPRESSION: Bilateral nodules. Dominant left upper pole nodule measures 3.0 cm. Findings meet consensus criteria for biopsy. Ultrasound-guided fine needle aspiration should be considered, as per the consensus statement: Management of Thyroid Nodules Detected at [place]: Society of Radiologists in Ultrasound Consensus Conference Statement. Radiology 2005;  [id number].   Electronically Signed   By: Maryclare Bean M.D.   On: 12/08/2014 08:36    CBC  Recent Labs Lab 12/03/14 2133 12/04/14 0720 12/05/14 0446  12/06/14 0414 12/07/14 0445  WBC 9.2 9.0 9.6 18.8* 15.8*  HGB 10.4* 10.3* 11.3* 10.3* 10.4*  HCT 33.7* 33.1* 36.4 33.7* 32.4*  PLT 386 345 407* 415* 403*  MCV 77.3* 77.0* 77.0* 77.8* 76.8*  MCH 23.9* 24.0* 23.9* 23.8* 24.6*  MCHC 30.9 31.1 31.0 30.6 32.1  RDW 15.3 15.3 15.3 15.4 15.4  LYMPHSABS 2.3  --   --   --   --   MONOABS 0.6  --   --   --   --   EOSABS 0.1  --   --   --   --   BASOSABS 0.0  --   --   --   --     Chemistries   Recent Labs Lab 12/03/14 2133 12/04/14 0720 12/05/14 0446 12/06/14 0414 12/07/14 0445  NA 139  --  138 138 141  K 4.0  --  4.0 4.2 4.8  CL 104  --  107 106 107  CO2 28  --  GLUCOSE 137*  --  185* 170* 147*  BUN 8  --  CREATININE 0.85 0.81 0.80 0.73 0.74  CALCIUM 9.1  --  9.2 9.2 9.1  AST 22  --   --   --   --   ALT 23  --   --   --   --   ALKPHOS 91  --   --   --   --   BILITOT 0.7  --   --   --   --    ------------------------------------------------------------------------------------------------------------------ estimated creatinine clearance is 130.1 mL/min (by C-G formula based on Cr of 0.74). ------------------------------------------------------------------------------------------------------------------ No results for input(s): HGBA1C in the last 72 hours. ------------------------------------------------------------------------------------------------------------------ No results for input(s): CHOL, HDL, LDLCALC, TRIG, CHOLHDL, LDLDIRECT in the last 72 hours. ------------------------------------------------------------------------------------------------------------------  Recent Labs  12/08/14 0915  TSH 0.834   ------------------------------------------------------------------------------------------------------------------ No results for input(s):  VITAMINB12, FOLATE, FERRITIN, TIBC, IRON, RETICCTPCT in the last 72 hours.  Coagulation profile No results for input(s): INR, PROTIME in the last 168 hours.  No results for input(s): DDIMER in the last 72 hours.  Cardiac Enzymes No results for input(s): CKMB, TROPONINI, MYOGLOBIN in the last 168 hours.  Invalid input(s): CK ------------------------------------------------------------------------------------------------------------------ Invalid input(s): POCBNP     Time Spent in minutes   25 minutes   Shanecia Hoganson M.D on 12/08/2014 at 1:38 PM  Between 7am to 7pm - Pager - [phone]  After 7pm go to [email or web address] - password [email or web address]  And look for the night coverage person covering for me after hours  [facility] Office  [phone]   **Disclaimer: This note may have been dictated with voice recognition software. Similar sounding words can inadvertently be transcribed and this note may contain transcription errors which may not have been corrected upon publication of note.**

## 2014-12-09 DIAGNOSIS — R202 Paresthesia of skin: Secondary | ICD-10-CM | POA: Insufficient documentation

## 2014-12-09 LAB — CBC
HEMATOCRIT: 32.4 % — AB (ref 36.0–46.0)
Hemoglobin: 10.3 g/dL — ABNORMAL LOW (ref 12.0–15.0)
MCH: 24.1 pg — AB (ref 26.0–34.0)
MCHC: 31.8 g/dL (ref 30.0–36.0)
MCV: 75.7 fL — ABNORMAL LOW (ref 78.0–100.0)
Platelets: 386 10*3/uL (ref 150–400)
RBC: 4.28 MIL/uL (ref 3.87–5.11)
RDW: 15 % (ref 11.5–15.5)
WBC: 14.1 10*3/uL — AB (ref 4.0–10.5)

## 2014-12-09 LAB — POCT I-STAT, CHEM 8
BUN: 19 mg/dL (ref 6–23)
CHLORIDE: 101 mmol/L (ref 96–112)
CREATININE: 0.9 mg/dL (ref 0.50–1.10)
Calcium, Ion: 1.3 mmol/L — ABNORMAL HIGH (ref 1.12–1.23)
Glucose, Bld: 184 mg/dL — ABNORMAL HIGH (ref 70–99)
HCT: 33 % — ABNORMAL LOW (ref 36.0–46.0)
Hemoglobin: 11.2 g/dL — ABNORMAL LOW (ref 12.0–15.0)
Potassium: 3.9 mmol/L (ref 3.5–5.1)
SODIUM: 140 mmol/L (ref 135–145)
TCO2: 22 mmol/L (ref 0–100)

## 2014-12-09 LAB — RETICULOCYTES
RBC.: 4.28 MIL/uL (ref 3.87–5.11)
RETIC CT PCT: 2.3 % (ref 0.4–3.1)
Retic Count, Absolute: 98.4 10*3/uL (ref 19.0–186.0)

## 2014-12-09 LAB — VDRL, CSF: SYPHILIS VDRL QUANT CSF: NONREACTIVE

## 2014-12-09 MED ORDER — SODIUM CHLORIDE 0.9 % IV SOLN
INTRAVENOUS | Status: AC
  Administered 2014-12-09 (×2): via INTRAVENOUS_CENTRAL
  Filled 2014-12-09 (×3): qty 200

## 2014-12-09 MED ORDER — HEPARIN SODIUM (PORCINE) 1000 UNIT/ML IJ SOLN
1000.0000 [IU] | Freq: Once | INTRAMUSCULAR | Status: DC
  Filled 2014-12-09: qty 1

## 2014-12-09 MED ORDER — DIPHENHYDRAMINE HCL 25 MG PO CAPS: 25.0000 mg | ORAL_CAPSULE | Freq: Four times a day (QID) | ORAL | Status: DC | PRN

## 2014-12-09 MED ORDER — ACD FORMULA A 0.73-2.45-2.2 GM/100ML VI SOLN
Status: AC
  Administered 2014-12-09: 16:00:00
  Filled 2014-12-09: qty 500

## 2014-12-09 MED ORDER — SODIUM CHLORIDE 0.9 % IV SOLN: INTRAVENOUS | Status: DC

## 2014-12-09 MED ORDER — ACETAMINOPHEN 325 MG PO TABS: 650.0000 mg | ORAL_TABLET | ORAL | Status: DC | PRN

## 2014-12-09 MED ORDER — SODIUM CHLORIDE 0.9 % IV SOLN
INTRAVENOUS | Status: DC
  Filled 2014-12-09 (×3): qty 200

## 2014-12-09 MED ORDER — SODIUM CHLORIDE 0.9 % IV SOLN
4.0000 g | Freq: Once | INTRAVENOUS | Status: DC
  Administered 2014-12-09 – 2014-12-11 (×2): 4 g via INTRAVENOUS
  Filled 2014-12-09: qty 40

## 2014-12-09 MED ORDER — ACD FORMULA A 0.73-2.45-2.2 GM/100ML VI SOLN
Status: AC
  Administered 2014-12-09: 15:00:00
  Filled 2014-12-09: qty 500

## 2014-12-09 MED ORDER — ACD FORMULA A 0.73-2.45-2.2 GM/100ML VI SOLN
500.0000 mL | Status: DC
  Filled 2014-12-09: qty 500

## 2014-12-09 MED ORDER — CALCIUM CARBONATE ANTACID 500 MG PO CHEW: 2.0000 | CHEWABLE_TABLET | ORAL | Status: DC

## 2014-12-09 NOTE — Progress Notes (Signed)
PROGRESS NOTE  Jessica Cisneros VOZ:[id number] DOB: September 07, 1976 DOA: 12/03/2014 PCP: Pcp Not In System  HPI: Ms. Jessica Cisneros is an 39 y.o. White female presented to the ER complaining of numbness and tingling starting at her genital region, then hip and progressing cephalad and now on her chest.Her legs and arms are now also involved. She is now s/p IV steroids for 5 days without improvement, PLEX started per neurology 1/27   Subjective - no new complaints today, continues to have sensation changes upper and lower extremity   Assessment/Plan: Paresthesia/transverse myelitis: - MRI showing evidence of acute demyelination in C4-C6. - Neurology on board - LP showing normal protein and glucose level and normal cell count, overall unremarkable - TSH and B-12 within normal limits, RPR nonreactive, ANA negative, HIV nonreactive, Lyme titer negative, - Low vitamin D level, started on vitamin D supplement. - PT/OT consult: recommend home health PT and 24 hour follow - Started on IV Solu Medrol 1 g daily - completed course 1/26 without much improvement - Given no improvement of patient's symptoms, she will be started on plasmapheresis 1/27 as per neurology recommendation for 5 treatments, IR achieved access 1/26  Low vitamin D - cont with supplement.  Leukocytosis - This is most likely related to her steroids - stable, improving.  Bacteriuria, symptomatic - On IV Rocephin for UTI treatment started 1/25 - Urine cultures not helpful.  Morbid obesity - BMI of 51 - Dietitian input appreciated  Incidental finding of thyroid nodule - Patient will need ultrasound-guided thyroid fine-needle biopsy as an outpatient, was discussed with Jessica Cisneros clinic PA Jessica Cisneros (patient's primary M.D. [place]) - TSH and free T4 within normal limits   DVT Prophylaxis:  Heparin injection  Code Status: Full Family Communication: Family at bedside Disposition Plan: Remain  inpatient  Consultants:  Neurology  IR  Procedures:  MRI brain 12/04/14: Normal MRI of the brain without without contrast  MRI C spine 12/04/14: No MR findings of demyelination in the thoracic spinal cord.   MRI thoracic spine 12/04/14: Mild degenerative change of thoracic spine without canal stenosis or  neural foraminal narrowing.   Antibiotics: Ceftriaxone 1/25 >>  Objective: Filed Vitals:   12/08/14 2120 12/08/14 2300 12/09/14 0534 12/09/14 0932  BP: 132/70 156/75 145/59 149/69  Pulse: 80 85 75 87  Temp: 98.3 F (36.8 C) 98.3 F (36.8 C) 98.4 F (36.9 C) 98.5 F (36.9 C)  TempSrc: Oral Oral Oral Oral  Resp: Height:      Weight:      SpO2: 100% 97% 98% 99%    Intake/Output Summary (Last 24 hours) at 12/09/14 1227 Last data filed at 12/09/14 0936  Gross per 24 hour  Intake    360 ml  Output      0 ml  Net    360 ml   Filed Weights   12/03/14 2131 12/08/14 1911  Weight: 136.079 kg (300 lb) 144.6 kg (318 lb 12.6 oz)   Exam: General: Well developed, well nourished, NAD, laying in bed, appears comfortable HEENT:   Anicteic Sclera, MMM.  Neck: Supple, no JVD, no masses  Cardiovascular: RRR, S1 S2 auscultated, no rubs, murmurs or gallops.   Respiratory: Clear to auscultation bilaterally with equal chest rise  Abdomen: Soft, nontender, nondistended Extremities: warm dry without cyanosis clubbing or edema.  Neuro: AAOx3, strength 5/5 in upper and lower extremities  Psych: Normal affect and demeanor with intact judgement and insight  Data Reviewed: Basic Metabolic  Panel:  Recent Labs Lab 12/03/14 2133 12/04/14 0720 12/05/14 0446 12/06/14 0414 12/07/14 0445  NA 139  --  138 138 141  K 4.0  --  4.0 4.2 4.8  CL 104  --  107 106 107  CO2 28  --  23 23 23   GLUCOSE 137*  --  185* 170* 147*  BUN 8  --  8 11 17   CREATININE 0.85 0.81 0.80 0.73 0.74  CALCIUM 9.1  --  9.2 9.2 9.1   Liver Function Tests:  Recent Labs Lab 12/03/14 2133  AST 22   ALT 23  ALKPHOS 91  BILITOT 0.7  PROT 6.8  ALBUMIN 3.6   CBC:  Recent Labs Lab 12/03/14 2133 12/04/14 0720 12/05/14 0446 12/06/14 0414 12/07/14 0445  WBC 9.2 9.0 9.6 18.8* 15.8*  NEUTROABS 6.2  --   --   --   --   HGB 10.4* 10.3* 11.3* 10.3* 10.4*  HCT 33.7* 33.1* 36.4 33.7* 32.4*  MCV 77.3* 77.0* 77.0* 77.8* 76.8*  PLT 386 345 407* 415* 403*   Recent Results (from the past 240 hour(s))  CSF culture     Status: None   Collection Time: 12/04/14 12:32 PM  Result Value Ref Range Status   Specimen Description CSF  Final   Special Requests CSF FLUID NO2 2.5ML  Final   Gram Stain   Final    CYTOSPIN NO WBC SEEN NO ORGANISMS SEEN Performed at [facility] Performed at [facility]    Culture   Final    NO GROWTH 3 DAYS Performed at [facility]    Report Status 12/08/2014 FINAL  Final  Gram stain     Status: None   Collection Time: 12/04/14 12:32 PM  Result Value Ref Range Status   Specimen Description CSF  Final   Special Requests NONE  Final   Gram Stain   Final    WBC PRESENT, PREDOMINANTLY MONONUCLEAR NO ORGANISMS SEEN CYTOSPIN SLIDE    Report Status 12/04/2014 FINAL  Final  Culture, Urine     Status: None   Collection Time: 12/06/14 11:09 PM  Result Value Ref Range Status   Specimen Description URINE, CLEAN CATCH  Final   Special Requests NONE  Final   Colony Count   Final    85,000 COLONIES/ML Performed at [facility]   Final    Multiple bacterial morphotypes present, none predominant. Suggest appropriate recollection if clinically indicated. Performed at [facility]    Report Status 12/08/2014 FINAL  Final   Studies: Ct Chest W Contrast  12/08/2014   CLINICAL DATA:  Evaluate for lymphadenopathy, to assess for possibility of sarcoidosis. Initial encounter.  EXAM: CT CHEST WITH CONTRAST  TECHNIQUE: Multidetector CT imaging of the chest was performed during intravenous contrast administration.   CONTRAST:  OMNIPAQUE IOHEXOL 300 MG/ML  SOLN  COMPARISON:  Thoracic spine MRI performed 12/04/2014  FINDINGS: Mild bilateral dependent subsegmental atelectasis is noted. The lungs are otherwise clear. There is no evidence of focal airspace consolidation, pleural effusion or pneumothorax. No masses are seen.  The mediastinum is unremarkable in appearance. No mediastinal lymphadenopathy is seen. No pericardial effusion is identified. The great vessels are grossly unremarkable. The patient's right PICC is noted ending about the distal SVC.  A 2.3 cm heterogeneous mass is again seen arising at the posterior aspect of the left thyroid lobe, similar in appearance to the prior MRI. No axillary lymphadenopathy is seen. Visualized  axillary nodes remain normal in size.  The visualized portions of the liver and spleen are unremarkable. The visualized portions of the adrenal glands and pancreas are within normal limits.  No acute osseous abnormalities are identified.  IMPRESSION: 1. No evidence of lymphadenopathy within the chest. 2. Mild bilateral dependent subsegmental atelectasis noted; lungs otherwise clear. 3. 2.3 cm heterogeneous mass again noted arising at the posterior aspect of the left thyroid lobe, as on the recent MRI. Recommend further evaluation with thyroid ultrasound. If patient is clinically hyperthyroid, consider nuclear medicine thyroid uptake and scan.   Electronically Signed   By: Roanna Raider M.D.   On: 12/08/2014 03:10   US Soft Tissue Head/neck  12/08/2014   CLINICAL DATA:  Left thyroid nodule  EXAM: THYROID ULTRASOUND  TECHNIQUE: Ultrasound examination of the thyroid gland and adjacent soft tissues was performed.  COMPARISON:  None.  FINDINGS: Right thyroid lobe  Measurements: 5.0 x 1.4 x 1.6 cm. 6 mm isoechoic nodule in the lower pole. Adjacent 9 mm solid nodule in the lower pole.  Left thyroid lobe  Measurements: 5.9 x 2.7 x 1.7 cm. Interpolar region nodule measures 1.7 x 0.9 x 1.4 cm.  Dominant solid left upper pole nodule measures 3.0 x 2.0 x 2.0 cm. It is homogeneous and isoechoic.  Isthmus  Thickness: 3 mm.  No nodules visualized.  Lymphadenopathy  None visualized.  IMPRESSION: Bilateral nodules. Dominant left upper pole nodule measures 3.0 cm. Findings meet consensus criteria for biopsy. Ultrasound-guided fine needle aspiration should be considered, as per the consensus statement: Management of Thyroid Nodules Detected at [place]: Society of Radiologists in Ultrasound Consensus Conference Statement. Radiology 2005; [id number].   Electronically Signed   By: Maryclare Bean M.D.   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 The patient requires placement of a tunneled central catheter for plasma exchange.  EXAM: TUNNELED CENTRAL VENOUS HEMODIALYSIS/PHERESIS CATHETER PLACEMENT WITH ULTRASOUND AND FLUOROSCOPIC GUIDANCE  ANESTHESIA/SEDATION: 2.0 mg IV Versed; 100 mcg IV Fentanyl.  Total Moderate Sedation Time  20 minutes.  MEDICATIONS: 3 g IV Ancef. As antibiotic prophylaxis, Ancef was ordered pre-procedure and administered intravenously within one hour of incision.  FLUOROSCOPY TIME:  48 seconds.  PROCEDURE: The procedure, risks, benefits, and alternatives were explained to the patient. Questions regarding the procedure were encouraged and answered. The patient understands and consents to the procedure.  The right neck and chest were prepped with chlorhexidine in a sterile fashion, and a sterile drape was applied covering the operative field. Maximum barrier sterile technique with sterile gowns and gloves were used for the procedure. Local anesthesia was provided with 1% lidocaine. A time-out was performed prior to the procedure.  Ultrasound was used to confirm patency of the right internal jugular vein. After creating a small venotomy incision, a 21 gauge needle was advanced into the right internal jugular vein under  direct, Cisneros-time ultrasound guidance. Ultrasound image documentation was performed. After securing guidewire access, an 8 Fr dilator was placed. A J-wire was kinked to measure appropriate catheter length.  A Bard Hemosplit tunneled hemodialysis catheter measuring 23 cm from tip to cuff was chosen for placement. This was tunneled in a retrograde fashion from the chest wall to the venotomy incision.  At the venotomy, serial dilatation was performed and a 16 Fr peel-away sheath was placed over a guidewire. The catheter was then placed through the sheath and the sheath removed.  Final catheter positioning was confirmed and documented with a fluoroscopic spot image. The catheter was aspirated, flushed with saline, and injected with appropriate volume heparin dwells.  The venotomy incision was closed with subcutaneous 3-0 Monocryl and subcuticular 4-0 Vicryl. Dermabond was applied to the incision. The catheter exit site was secured with 0-Prolene retention sutures.  COMPLICATIONS: None.  No pneumothorax.  FINDINGS: After catheter placement, the tips lie in the right atrium. The catheter aspirates normally and is ready for immediate use.  IMPRESSION: Placement of tunneled hemodialysis/pheresis catheter via the right internal jugular vein. The catheter tips lie in the right atrium. The catheter is ready for immediate use.   Electronically Signed   By: Irish Lack M.D.   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 The patient requires placement of a tunneled central catheter for plasma exchange.  EXAM: TUNNELED CENTRAL VENOUS HEMODIALYSIS/PHERESIS CATHETER PLACEMENT WITH ULTRASOUND AND FLUOROSCOPIC GUIDANCE  ANESTHESIA/SEDATION: 2.0 mg IV Versed; 100 mcg IV Fentanyl.  Total Moderate Sedation Time  20 minutes.  MEDICATIONS: 3 g IV Ancef. As antibiotic prophylaxis, Ancef was ordered pre-procedure and administered intravenously within  one hour of incision.  FLUOROSCOPY TIME:  48 seconds.  PROCEDURE: The procedure, risks, benefits, and alternatives were explained to the patient. Questions regarding the procedure were encouraged and answered. The patient understands and consents to the procedure.  The right neck and chest were prepped with chlorhexidine in a sterile fashion, and a sterile drape was applied covering the operative field. Maximum barrier sterile technique with sterile gowns and gloves were used for the procedure. Local anesthesia was provided with 1% lidocaine. A time-out was performed prior to the procedure.  Ultrasound was used to confirm patency of the right internal jugular vein. After creating a small venotomy incision, a 21 gauge needle was advanced into the right internal jugular vein under direct, Cisneros-time ultrasound guidance. Ultrasound image documentation was performed. After securing guidewire access, an 8 Fr dilator was placed. A J-wire was kinked to measure appropriate catheter length.  A Bard Hemosplit tunneled hemodialysis catheter measuring 23 cm from tip to cuff was chosen for placement. This was tunneled in a retrograde fashion from the chest wall to the venotomy incision.  At the venotomy, serial dilatation was performed and a 16 Fr peel-away sheath was placed over a guidewire. The catheter was then placed through the sheath and the sheath removed. Final catheter positioning was confirmed and documented with a fluoroscopic spot image. The catheter was aspirated, flushed with saline, and injected with appropriate volume heparin dwells.  The venotomy incision was closed with subcutaneous 3-0 Monocryl and subcuticular 4-0 Vicryl. Dermabond was applied to the incision. The catheter exit site was secured with 0-Prolene retention sutures.  COMPLICATIONS: None.  No pneumothorax.  FINDINGS: After catheter placement, the tips lie in the right atrium. The catheter aspirates normally and is ready for immediate use.   IMPRESSION: Placement of tunneled hemodialysis/pheresis catheter via the right internal jugular vein. The catheter tips lie in the right atrium. The catheter is ready for immediate use.   Electronically Signed   By: Irish Lack M.D.   On: 12/08/2014 16:30    Scheduled Meds: . cefTRIAXone (ROCEPHIN)  IV  2 g Intravenous Q24H  . heparin  1,000 Units Intracatheter Once  . heparin subcutaneous  5,000 Units Subcutaneous 3 times per day  . nystatin   Topical BID  .  pantoprazole  40 mg Oral Daily  . Vitamin D (Ergocalciferol)  50,000 Units Oral Q7 days   Continuous Infusions: . citrate dextrose      Active Problems:   Paresthesia   PCOS (polycystic ovarian syndrome)   Obesity   Low serum vitamin D   Transverse myelitis  Caroline E. Howell-Methvin, PA-S   Paloma [place]ange M. Elvera Lennox, MD [facility] [phone] If 7PM-7AM, please contact night-coverage at [email or web address], password [facility] 12/09/2014, 12:27 PM  LOS: 6 days

## 2014-12-09 NOTE — Progress Notes (Signed)
Subjective: PLEX started, no complications  Exam: Filed Vitals:   12/09/14 0932  BP: 149/69  Pulse: 87  Temp: 98.5 F (36.9 C)  Resp: 20   Gen: In bed, NAD MS: awake, alert, interactive and appropriate YI:RSWNI, EOMI Motor: 5/5 thoruhgout Sensory: decreased to temp to the upper shoulders.    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  With no response to IV steroids, have begun plasma exchange. She will get total of 5 treatments.   Recommendations: 1) PLEX, will do 3 tx daily, then QOD for last two. Today Tx #2  Ritta Slot, MD [facility] [phone]  If 7pm- 7am, please page neurology on call as listed in AMION.

## 2014-12-10 LAB — VITAMIN B1: Vitamin B1 (Thiamine): <7 nmol/L — ABNORMAL LOW (ref 8–30)

## 2014-12-10 LAB — VITAMIN D 1,25 DIHYDROXY
VITAMIN D 1, 25 (OH) TOTAL: 48 pg/mL (ref 18–72)
Vitamin D2 1, 25 (OH)2: <8 pg/mL
Vitamin D3 1, 25 (OH)2: 48 pg/mL

## 2014-12-10 LAB — MISCELLANEOUS TEST

## 2014-12-10 LAB — METHYLMALONIC ACID, SERUM: Methylmalonic Acid, Quantitative: 168 nmol/L (ref 87–318)

## 2014-12-10 MED ORDER — SODIUM CHLORIDE 0.9 % IV SOLN
INTRAVENOUS | Status: AC
  Administered 2014-12-11 (×2): via INTRAVENOUS_CENTRAL
  Filled 2014-12-10 (×3): qty 200

## 2014-12-10 MED ORDER — ACD FORMULA A 0.73-2.45-2.2 GM/100ML VI SOLN
Status: AC
  Administered 2014-12-11: 500 mL via INTRAVENOUS
  Filled 2014-12-10: qty 1000

## 2014-12-10 NOTE — Progress Notes (Addendum)
Subjective: She states she feels no different. S/P 1 PLX treatments  Objective: Current vital signs: BP 145/73 mmHg  Pulse 73  Temp(Src) 97.9 F (36.6 C) (Oral)  Resp 22  Ht  (1.626 m)  Wt 147.5 kg (325 lb 2.9 oz)  BMI 55.79 kg/m2  SpO2 100%  LMP 11/26/2014 Vital signs in last 24 hours: Temp:  [97.9 F (36.6 C)-99.2 F (37.3 C)] 97.9 F (36.6 C) (01/28 0700) Pulse Rate:  [59-88] 73 (01/28 0700) Resp:  [18-26] 22 (01/28 0700) BP: (138-160)/(57-76) 145/73 mmHg (01/28 0700) SpO2:  [98 %-100 %] 100 % (01/28 0700) Weight:  [146.3 kg (322 lb 8.5 oz)-147.5 kg (325 lb 2.9 oz)] 147.5 kg (325 lb 2.9 oz) (01/27 1639)  Intake/Output from previous day: 01/27 0701 - 01/28 0700 In: 360 [P.O.:360] Out: -  Intake/Output this shift:   Nutritional status: Diet - low sodium heart healthy Diet regular  Neurologic Exam: Gen: In bed, NAD MS: awake, alert, interactive and appropriate WU:JWJXB, EOMI Motor: 5/5 thoruhgout Sensory: decreased to temp to the upper shoulders and decreased light touch from breat line below.   Lab Results: Basic Metabolic Panel:  Recent Labs Lab 12/03/14 2133 12/04/14 0720 12/05/14 0446 12/06/14 0414 12/07/14 0445 12/09/14 1506  NA 139  --  138 138 141 140  K 4.0  --  4.0 4.2 4.8 3.9  CL 104  --  107 106 107 101  CO2 28  --  --   GLUCOSE 137*  --  185* 170* 147* 184*  BUN 8  --  CREATININE 0.85 0.81 0.80 0.73 0.74 0.90  CALCIUM 9.1  --  9.2 9.2 9.1  --     Liver Function Tests:  Recent Labs Lab 12/03/14 2133  AST 22  ALT 23  ALKPHOS 91  BILITOT 0.7  PROT 6.8  ALBUMIN 3.6   No results for input(s): LIPASE, AMYLASE in the last 168 hours. No results for input(s): AMMONIA in the last 168 hours.  CBC:  Recent Labs Lab 12/03/14 2133 12/04/14 0720 12/05/14 0446 12/06/14 0414 12/07/14 0445 12/09/14 1506 12/09/14 1510  WBC 9.2 9.0 9.6 18.8* 15.8*  --  14.1*  NEUTROABS 6.2  --   --   --   --   --   --   HGB  10.4* 10.3* 11.3* 10.3* 10.4* 11.2* 10.3*  HCT 33.7* 33.1* 36.4 33.7* 32.4* 33.0* 32.4*  MCV 77.3* 77.0* 77.0* 77.8* 76.8*  --  75.7*  PLT 386 345 407* 415* 403*  --  386    Cardiac Enzymes: No results for input(s): CKTOTAL, CKMB, CKMBINDEX, TROPONINI in the last 168 hours.  Lipid Panel: No results for input(s): CHOL, TRIG, HDL, CHOLHDL, VLDL, LDLCALC in the last 168 hours.  CBG: No results for input(s): GLUCAP in the last 168 hours.  Microbiology: Results for orders placed or performed during the hospital encounter of 12/03/14  CSF culture     Status: None   Collection Time: 12/04/14 12:32 PM  Result Value Ref Range Status   Specimen Description CSF  Final   Special Requests CSF FLUID NO2 2.5ML  Final   Gram Stain   Final    CYTOSPIN NO WBC SEEN NO ORGANISMS SEEN Performed at [facility] Performed at [facility]    Culture   Final    NO GROWTH 3 DAYS Performed at [facility]    Report Status 12/08/2014 FINAL  Final  Gram  stain     Status: None   Collection Time: 12/04/14 12:32 PM  Result Value Ref Range Status   Specimen Description CSF  Final   Special Requests NONE  Final   Gram Stain   Final    WBC PRESENT, PREDOMINANTLY MONONUCLEAR NO ORGANISMS SEEN CYTOSPIN SLIDE    Report Status 12/04/2014 FINAL  Final  Culture, Urine     Status: None   Collection Time: 12/06/14 11:09 PM  Result Value Ref Range Status   Specimen Description URINE, CLEAN CATCH  Final   Special Requests NONE  Final   Colony Count   Final    85,000 COLONIES/ML Performed at [facility]    Culture   Final    Multiple bacterial morphotypes present, none predominant. Suggest appropriate recollection if clinically indicated. 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 The patient requires placement of a tunneled central catheter for plasma exchange.  EXAM: TUNNELED CENTRAL VENOUS HEMODIALYSIS/PHERESIS CATHETER PLACEMENT WITH ULTRASOUND AND FLUOROSCOPIC GUIDANCE  ANESTHESIA/SEDATION: 2.0 mg IV Versed; 100 mcg IV Fentanyl.  Total Moderate Sedation Time  20 minutes.  MEDICATIONS: 3 g IV Ancef. As antibiotic prophylaxis, Ancef was ordered pre-procedure and administered intravenously within one hour of incision.  FLUOROSCOPY TIME:  48 seconds.  PROCEDURE: The procedure, risks, benefits, and alternatives were explained to the patient. Questions regarding the procedure were encouraged and answered. The patient understands and consents to the procedure.  The right neck and chest were prepped with chlorhexidine in a sterile fashion, and a sterile drape was applied covering the operative field. Maximum barrier sterile technique with sterile gowns and gloves were used for the procedure. Local anesthesia was provided with 1% lidocaine. A time-out was performed prior to the procedure.  Ultrasound was used to confirm patency of the right internal jugular vein. After creating a small venotomy incision, a 21 gauge needle was advanced into the right internal jugular vein under direct, real-time ultrasound guidance. Ultrasound image documentation was performed. After securing guidewire access, an 8 Fr dilator was placed. A J-wire was kinked to measure appropriate catheter length.  A Bard Hemosplit tunneled hemodialysis catheter measuring 23 cm from tip to cuff was chosen for placement. This was tunneled in a retrograde fashion from the chest wall to the venotomy incision.  At the venotomy, serial dilatation was performed and a 16 Fr peel-away sheath was placed over a guidewire. The catheter was then placed through the sheath and the sheath removed. Final catheter positioning was confirmed and documented with a fluoroscopic spot image. The catheter  was aspirated, flushed with saline, and injected with appropriate volume heparin dwells.  The venotomy incision was closed with subcutaneous 3-0 Monocryl and subcuticular 4-0 Vicryl. Dermabond was applied to the incision. The catheter exit site was secured with 0-Prolene retention sutures.  COMPLICATIONS: None.  No pneumothorax.  FINDINGS: After catheter placement, the tips lie in the right atrium. The catheter aspirates normally and is ready for immediate use.  IMPRESSION: Placement of tunneled hemodialysis/pheresis catheter via the right internal jugular vein. The catheter tips lie in the right atrium. The catheter is ready for immediate use.   Electronically Signed   By: Irish Lack M.D.   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 The patient requires placement of a tunneled central catheter for plasma exchange.  EXAM: TUNNELED CENTRAL VENOUS HEMODIALYSIS/PHERESIS CATHETER PLACEMENT WITH ULTRASOUND AND FLUOROSCOPIC GUIDANCE  ANESTHESIA/SEDATION: 2.0 mg IV Versed; 100 mcg IV Fentanyl.  Total Moderate Sedation Time  20 minutes.  MEDICATIONS: 3 g IV Ancef. As antibiotic prophylaxis, Ancef was ordered pre-procedure and administered intravenously within one hour of incision.  FLUOROSCOPY TIME:  48 seconds.  PROCEDURE: The procedure, risks, benefits, and alternatives were explained to the patient. Questions regarding the procedure were encouraged and answered. The patient understands and consents to the procedure.  The right neck and chest were prepped with chlorhexidine in a sterile fashion, and a sterile drape was applied covering the operative field. Maximum barrier sterile technique with sterile gowns and gloves were used for the procedure. Local anesthesia was provided with 1% lidocaine. A time-out was performed prior to the procedure.  Ultrasound was used to confirm patency of the right internal jugular vein. After  creating a small venotomy incision, a 21 gauge needle was advanced into the right internal jugular vein under direct, real-time ultrasound guidance. Ultrasound image documentation was performed. After securing guidewire access, an 8 Fr dilator was placed. A J-wire was kinked to measure appropriate catheter length.  A Bard Hemosplit tunneled hemodialysis catheter measuring 23 cm from tip to cuff was chosen for placement. This was tunneled in a retrograde fashion from the chest wall to the venotomy incision.  At the venotomy, serial dilatation was performed and a 16 Fr peel-away sheath was placed over a guidewire. The catheter was then placed through the sheath and the sheath removed. Final catheter positioning was confirmed and documented with a fluoroscopic spot image. The catheter was aspirated, flushed with saline, and injected with appropriate volume heparin dwells.  The venotomy incision was closed with subcutaneous 3-0 Monocryl and subcuticular 4-0 Vicryl. Dermabond was applied to the incision. The catheter exit site was secured with 0-Prolene retention sutures.  COMPLICATIONS: None.  No pneumothorax.  FINDINGS: After catheter placement, the tips lie in the right atrium. The catheter aspirates normally and is ready for immediate use.  IMPRESSION: Placement of tunneled hemodialysis/pheresis catheter via the right internal jugular vein. The catheter tips lie in the right atrium. The catheter is ready for immediate use.   Electronically Signed   By: Irish Lack M.D.   On: 12/08/2014 16:30    Medications:  Scheduled: . cefTRIAXone (ROCEPHIN)  IV  2 g Intravenous Q24H  . heparin subcutaneous  5,000 Units Subcutaneous 3 times per day  . nystatin   Topical BID  . pantoprazole  40 mg Oral Daily  . Vitamin D (Ergocalciferol)  50,000 Units Oral Q7 days    Assessment/Plan:  Impression: 39 yo F with transverse myelitis. With no response to IV steroids, have begun plasma exchange. She will get total of 5  treatments. She has received 1 treatments with no change.  Will have 2nd treatment today and then last two every other day.   Recommendations: 1) PLEX,    Felicie Morn PA-C [facility] [phone]  12/10/2014, 9:28 AM

## 2014-12-10 NOTE — Progress Notes (Signed)
Occupational Therapy Treatment Patient Details Name: Jessica Cisneros MRN: [id number] DOB: November 22, 1975 Today's Date: 12/10/2014    History of present illness   Pt is a 39 y.o. Female admitted 12/03/14 with ascending parasthesia with c/o numbness and tingling for 3 weeks of her LEs, torso up to chest, and hands. 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 Pt currently receiving solumedrol treatments. (? working diagnosis is transverse myelitis)   OT comments  Patient is progressing towards OT goals. Will continue to follow patient acutely, new goals added to plan of care.   Follow Up Recommendations  No OT follow up;Supervision/Assistance - 24 hour    Equipment Recommendations  None recommended by OT    Recommendations for Other Services  None at this time.     Precautions / Restrictions Precautions Precautions: Fall Restrictions Weight Bearing Restrictions: No       Mobility Bed Mobility General bed mobility comments: Patient found seated in recliner upon OT entering room  Transfers Overall transfer level: Modified independent Equipment used: None Transfers: Sit to/from Stand    Balance  No apparent deficits.    ADL Overall ADL's : Needs assistance/impaired Eating/Feeding: Set up   Grooming: Wash/dry hands;Wash/dry face;Standing;Supervision/safety;Cueing for compensatory techniques   Upper Body Bathing: Standing;Supervision/ safety;Cueing for compensatory techniques   Lower Body Bathing: Supervison/ safety;Cueing for compensatory techniques;Sit to/from stand   Upper Body Dressing : Supervision/safety;Cueing for compensatory techniques;Standing   Lower Body Dressing: Supervision/safety;Cueing for compensatory techniques;Sit to/from stand               Functional mobility during ADLs: Supervision/safety General  ADL Comments: Administerred LH sponge for LB bathing. Patient requires cues for compensatory techniques, patient tends to perceverate on decreased sensation and has a hard time listening to compensatory stragies taught by therapist. Have asked patient to call [employer] [facility] multiple times, but patient has still not called regarding house remodeling; encouraged patient again. NT came to therapist regarding toileting aid, do not think this is appropriate for patient at this time. Patient with good strengh in bilateral hands and secondary to poor sensation do not believe patient will be able to hold onto toielting aid in order to perform peri care.                 Cognition   Behavior During Therapy: WFL for tasks assessed/performed Overall Cognitive Status: Within Functional Limits for tasks assessed                 Pertinent Vitals/ Pain       Pain Assessment: No/denies pain         Frequency Min 2X/week     Progress Toward Goals  OT Goals(current goals can now be found in the care plan section)  Progress towards OT goals: Progressing toward goals     Plan Discharge plan remains appropriate          Activity Tolerance Patient tolerated treatment well   Patient Left in chair;with call bell/phone within reach;with family/visitor present     Time: 8016-5537 OT Time Calculation (min): 32 min  Charges: OT General Charges $OT Visit: 1 Procedure OT Treatments $Self Care/Home Management : 23-37 mins  Jessica Cisneros , MS, OTR/L, CLT Pager: [phone]  12/10/2014, 2:18 PM

## 2014-12-10 NOTE — Progress Notes (Signed)
PROGRESS NOTE  Jessica Cisneros NPY:[id number] DOB: 05/24/1976 DOA: 12/03/2014 PCP: Pcp Not In System  HPI: Jessica Cisneros is an 39 y.o. White female presented to the ER complaining of numbness and tingling starting at her genital region, then hip and progressing cephalad and now on her chest.Her legs and arms are now also involved. She is now s/p IV steroids for 5 days without improvement, PLEX started per neurology 1/27  Subjective: - no appreciable improvement with plasmapheresis after 1 session, only complaint today is about her hands and feet feeling "puffy"  Assessment/Plan: Paresthesia/transverse myelitis: - MRI showing evidence of acute demyelination in C4-C6. - Neurology following - LP showing normal protein and glucose level and normal cell count, overall unremarkable - TSH and B-12 within normal limits, RPR nonreactive, ANA negative, HIV nonreactive, Lyme titer negative, - Low vitamin D level, started on vitamin D supplement. - PT/OT consult: recommend home health PT and 24 hour follow - Started on IV Solu Medrol 1 g daily - completed course 1/26 without much improvement - Given no improvement of patient's symptoms, she was started on plasmapheresis 1/27 as per neurology recommendation for 5 treatments, IR achieved access 1/26, today will be session 2.  Mild fluid overload - Likely due to fluid retention after steroid treatment, IVF - Steroid regimen complete, IV fluids stopped today, encourage ambulation - check weights  Low vitamin D - Continue with supplement.  Leukocytosis - This is most likely related to her steroids - Stable, improving  Bacteriuria, symptomatic - On IV Rocephin for UTI treatment started 1/25 - Urine cultures not helpful.  Morbid obesity - BMI of 51 - Met with a dietician on 1/26, they "expect good compliance" with their recommendations  Incidental finding of thyroid nodule - Patient will need ultrasound-guided thyroid fine-needle biopsy as an  outpatient, was discussed with [name] Volanda Napoleon (patient's primary M.D. [place]) - TSH and free T4 within normal limits  DVT Prophylaxis:  Heparin injection  Code Status: Full Family Communication: Family at bedside Disposition Plan: Remain inpatient  Consultants:  Neurology  IR  Procedures:  MRI brain 12/04/14: Normal MRI of the brain without without contrast  MRI C spine 12/04/14: No MR findings of demyelination in the thoracic spinal cord.   MRI thoracic spine 12/04/14: Mild degenerative change of thoracic spine without canal stenosis or neural foraminal narrowing.  Plasmapheresis starting 12/09/14  Antibiotics: Ceftriaxone 1/25  Objective: Filed Vitals:   12/09/14 1750 12/10/14 0209 12/10/14 0700 12/10/14 1025  BP: 146/76 138/57 145/73 145/75  Pulse: 74 88 73 88  Temp: 99.2 F (37.3 C) 98.1 F (36.7 C) 97.9 F (36.6 C) 98.7 F (37.1 C)  TempSrc: Oral Oral Oral Oral  Resp: '20 18 22 20  ' Height:      Weight:      SpO2: 98% 100% 100% 100%    Intake/Output Summary (Last 24 hours) at 12/10/14 1311 Last data filed at 12/10/14 1031  Gross per 24 hour  Intake    480 ml  Output      0 ml  Net    480 ml   Filed Weights   12/08/14 1911 12/09/14 1500 12/09/14 1639  Weight: 144.6 kg (318 lb 12.6 oz) 146.3 kg (322 lb 8.5 oz) 147.5 kg (325 lb 2.9 oz)   Exam: General: Well developed, well nourished, NAD, appears stated age, sitting in chair with family member beside her  HEENT:  Anicteic Sclera, MMM.  Neck: Supple, no JVD, no masses  Cardiovascular:  RRR, S1 S2 auscultated, no rubs, murmurs or gallops.   Respiratory: Clear to auscultation bilaterally with equal chest rise  Abdomen: Soft, nontender, nondistended Extremities: warm dry without cyanosis clubbing or edema.  Neuro: AAOx3  Skin: Without rashes exudates or nodules.   Psych: Normal affect and demeanor with intact judgement and insight  Data Reviewed: Basic Metabolic Panel:  Recent Labs Lab  12/03/14 2133 12/04/14 0720 12/05/14 0446 12/06/14 0414 12/07/14 0445 12/09/14 1506  NA 139  --  138 138 141 140  K 4.0  --  4.0 4.2 4.8 3.9  CL 104  --  107 106 107 101  CO2 28  --  '23 23 23  ' --   GLUCOSE 137*  --  185* 170* 147* 184*  BUN 8  --  '8 11 17 19  ' CREATININE 0.85 0.81 0.80 0.73 0.74 0.90  CALCIUM 9.1  --  9.2 9.2 9.1  --    Liver Function Tests:  Recent Labs Lab 12/03/14 2133  AST 22  ALT 23  ALKPHOS 91  BILITOT 0.7  PROT 6.8  ALBUMIN 3.6   CBC:  Recent Labs Lab 12/03/14 2133 12/04/14 0720 12/05/14 0446 12/06/14 0414 12/07/14 0445 12/09/14 1506 12/09/14 1510  WBC 9.2 9.0 9.6 18.8* 15.8*  --  14.1*  NEUTROABS 6.2  --   --   --   --   --   --   HGB 10.4* 10.3* 11.3* 10.3* 10.4* 11.2* 10.3*  HCT 33.7* 33.1* 36.4 33.7* 32.4* 33.0* 32.4*  MCV 77.3* 77.0* 77.0* 77.8* 76.8*  --  75.7*  PLT 386 345 407* 415* 403*  --  386    Recent Results (from the past 240 hour(s))  CSF culture     Status: None   Collection Time: 12/04/14 12:32 PM  Result Value Ref Range Status   Specimen Description CSF  Final   Special Requests CSF FLUID NO2 2.5ML  Final   Gram Stain   Final    CYTOSPIN NO WBC SEEN NO ORGANISMS SEEN Performed at [facility] Performed at [place]   Final    NO GROWTH 3 DAYS Performed at [facility]    Report Status 12/08/2014 FINAL  Final  Gram stain     Status: None   Collection Time: 12/04/14 12:32 PM  Result Value Ref Range Status   Specimen Description CSF  Final   Special Requests NONE  Final   Gram Stain   Final    WBC PRESENT, PREDOMINANTLY MONONUCLEAR NO ORGANISMS SEEN CYTOSPIN SLIDE    Report Status 12/04/2014 FINAL  Final  Culture, Urine     Status: None   Collection Time: 12/06/14 11:09 PM  Result Value Ref Range Status   Specimen Description URINE, CLEAN CATCH  Final   Special Requests NONE  Final   Colony Count   Final    85,000 COLONIES/ML Performed at [facility]   Final    Multiple bacterial morphotypes present, none predominant. Suggest appropriate recollection if clinically indicated. 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 The patient requires placement of a tunneled central catheter for plasma exchange.  EXAM: TUNNELED CENTRAL VENOUS HEMODIALYSIS/PHERESIS CATHETER PLACEMENT WITH ULTRASOUND AND FLUOROSCOPIC GUIDANCE  ANESTHESIA/SEDATION: 2.0 mg IV Versed; 100  mcg IV Fentanyl.  Total Moderate Sedation Time  20 minutes.  MEDICATIONS: 3 g IV Ancef. As antibiotic prophylaxis, Ancef was ordered pre-procedure and administered intravenously within one hour of incision.  FLUOROSCOPY TIME:  48 seconds.  PROCEDURE: The procedure, risks, benefits, and alternatives were explained to the patient. Questions regarding the procedure were encouraged and answered. The patient understands and consents to the procedure.  The right neck and chest were prepped with chlorhexidine in a sterile fashion, and a sterile drape was applied covering the operative field. Maximum barrier sterile technique with sterile gowns and gloves were used for the procedure. Local anesthesia was provided with 1% lidocaine. A time-out was performed prior to the procedure.  Ultrasound was used to confirm patency of the right internal jugular vein. After creating a small venotomy incision, a 21 gauge needle was advanced into the right internal jugular vein under direct, real-time ultrasound guidance. Ultrasound image documentation was performed. After securing guidewire access, an 8 Fr dilator was placed. A J-wire was kinked to measure appropriate catheter length.  A Bard Hemosplit tunneled hemodialysis catheter measuring 23 cm from tip to cuff was chosen for placement. This was tunneled in a retrograde fashion from the chest wall to the  venotomy incision.  At the venotomy, serial dilatation was performed and a 16 Fr peel-away sheath was placed over a guidewire. The catheter was then placed through the sheath and the sheath removed. Final catheter positioning was confirmed and documented with a fluoroscopic spot image. The catheter was aspirated, flushed with saline, and injected with appropriate volume heparin dwells.  The venotomy incision was closed with subcutaneous 3-0 Monocryl and subcuticular 4-0 Vicryl. Dermabond was applied to the incision. The catheter exit site was secured with 0-Prolene retention sutures.  COMPLICATIONS: None.  No pneumothorax.  FINDINGS: After catheter placement, the tips lie in the right atrium. The catheter aspirates normally and is ready for immediate use.  IMPRESSION: Placement of tunneled hemodialysis/pheresis catheter via the right internal jugular vein. The catheter tips lie in the right atrium. The catheter is ready for immediate use.   Electronically Signed   By: Aletta Edouard M.D.   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 The patient requires placement of a tunneled central catheter for plasma exchange.  EXAM: TUNNELED CENTRAL VENOUS HEMODIALYSIS/PHERESIS CATHETER PLACEMENT WITH ULTRASOUND AND FLUOROSCOPIC GUIDANCE  ANESTHESIA/SEDATION: 2.0 mg IV Versed; 100 mcg IV Fentanyl.  Total Moderate Sedation Time  20 minutes.  MEDICATIONS: 3 g IV Ancef. As antibiotic prophylaxis, Ancef was ordered pre-procedure and administered intravenously within one hour of incision.  FLUOROSCOPY TIME:  48 seconds.  PROCEDURE: The procedure, risks, benefits, and alternatives were explained to the patient. Questions regarding the procedure were encouraged and answered. The patient understands and consents to the procedure.  The right neck and chest were prepped with chlorhexidine in a sterile fashion, and a sterile drape was applied  covering the operative field. Maximum barrier sterile technique with sterile gowns and gloves were used for the procedure. Local anesthesia was provided with 1% lidocaine. A time-out was performed prior to the procedure.  Ultrasound was used to confirm patency of the right internal jugular vein. After creating a small venotomy incision, a 21 gauge needle was advanced into the right internal jugular vein under direct, real-time ultrasound guidance. Ultrasound image documentation was performed. After securing guidewire access, an 8 Fr dilator was placed. A  J-wire was kinked to measure appropriate catheter length.  A Bard Hemosplit tunneled hemodialysis catheter measuring 23 cm from tip to cuff was chosen for placement. This was tunneled in a retrograde fashion from the chest wall to the venotomy incision.  At the venotomy, serial dilatation was performed and a 16 Fr peel-away sheath was placed over a guidewire. The catheter was then placed through the sheath and the sheath removed. Final catheter positioning was confirmed and documented with a fluoroscopic spot image. The catheter was aspirated, flushed with saline, and injected with appropriate volume heparin dwells.  The venotomy incision was closed with subcutaneous 3-0 Monocryl and subcuticular 4-0 Vicryl. Dermabond was applied to the incision. The catheter exit site was secured with 0-Prolene retention sutures.  COMPLICATIONS: None.  No pneumothorax.  FINDINGS: After catheter placement, the tips lie in the right atrium. The catheter aspirates normally and is ready for immediate use.  IMPRESSION: Placement of tunneled hemodialysis/pheresis catheter via the right internal jugular vein. The catheter tips lie in the right atrium. The catheter is ready for immediate use.   Electronically Signed   By: Aletta Edouard M.D.   On: 12/08/2014 16:30    Scheduled Meds: . cefTRIAXone (ROCEPHIN)  IV  2 g Intravenous Q24H  . heparin subcutaneous  5,000 Units Subcutaneous 3  times per day  . nystatin   Topical BID  . pantoprazole  40 mg Oral Daily  . Vitamin D (Ergocalciferol)  50,000 Units Oral Q7 days   Continuous Infusions:   Active Problems:   Paresthesia   PCOS (polycystic ovarian syndrome)   Obesity   Low serum vitamin D   Transverse myelitis   Paresthesias  Caroline E. Howell-Methvin, PA-S  Time spent: 25 minutes, more than 50% in family discussions  Guilianna Mckoy M. Cruzita Lederer, MD [facility] [phone] If 7PM-7AM, please contact night-coverage at [email or web address], password [facility] 12/10/2014, 1:11 PM  LOS: 7 days

## 2014-12-10 NOTE — Progress Notes (Signed)
Physical Therapy Treatment Patient Details Name: Jessica Cisneros MRN: [id number] DOB: [birth year] Today's Date: 12/10/2014    History of Present Illness   Pt is a 39 y.o. Female admitted 12/03/14 with ascending parasthesia with c/o numbness and tingling for 3 weeks of her LEs, torso up to chest, and hands. 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 Pt currently receiving solumedrol treatments. (? working diagnosis is transverse myelitis)    PT Comments    Patient progressing well. Encouraged to ambulate with family. Will be here until at least Monday until plasmapheresis is complete. Will check up on patient monday  Follow Up Recommendations  Home health PT     Equipment Recommendations  None recommended by PT    Recommendations for Other Services       Precautions / Restrictions Precautions Precautions: Fall    Mobility  Bed Mobility Overal bed mobility: Modified Independent                Transfers Overall transfer level: Modified independent                  Ambulation/Gait Ambulation/Gait assistance: Supervision Ambulation Distance (Feet): 510 Feet Assistive device: None Gait Pattern/deviations: Step-through pattern;Decreased stride length;Wide base of support Gait velocity: able to vary up and down Gait velocity interpretation: Below normal speed for age/gender General Gait Details: Patient guarded but ambulating well. Continues to state some numbness but improving   Stairs   Stairs assistance: Min guard Stair Management: One rail Left;Forwards;Alternating pattern Number of Stairs: 10 General stair comments: No LOB. Good Bilateral foot clearance  Wheelchair Mobility    Modified Rankin (Stroke Patients Only)       Balance                                    Cognition  Arousal/Alertness: Awake/alert Behavior During Therapy: WFL for tasks assessed/performed Overall Cognitive Status: Within Functional Limits for tasks assessed                      Exercises      General Comments        Pertinent Vitals/Pain Pain Assessment: No/denies pain    Home Living                      Prior Function            PT Goals (current goals can now be found in the care plan section) Progress towards PT goals: Progressing toward goals    Frequency  Min 3X/week    PT Plan Current plan remains appropriate    Co-evaluation             End of Session   Activity Tolerance: Patient tolerated treatment well Patient left: in chair;with call bell/phone within reach;with family/visitor present     Time: 0741-0802 PT Time Calculation (min) (ACUTE ONLY): 21 min  Charges:  $Gait Training: 8-22 mins                    G Codes:      Fredrich Birks 12/10/2014, 8:05 AM  12/10/2014 Fredrich Birks PTA [phone] pager [phone] office

## 2014-12-11 DIAGNOSIS — N390 Urinary tract infection, site not specified: Secondary | ICD-10-CM

## 2014-12-11 DIAGNOSIS — R609 Edema, unspecified: Secondary | ICD-10-CM

## 2014-12-11 LAB — POCT I-STAT, CHEM 8
BUN: 16 mg/dL (ref 6–23)
CREATININE: 0.6 mg/dL (ref 0.50–1.10)
Calcium, Ion: 1.17 mmol/L (ref 1.12–1.23)
Chloride: 101 mmol/L (ref 96–112)
Glucose, Bld: 108 mg/dL — ABNORMAL HIGH (ref 70–99)
HCT: 30 % — ABNORMAL LOW (ref 36.0–46.0)
HEMOGLOBIN: 10.2 g/dL — AB (ref 12.0–15.0)
POTASSIUM: 3.6 mmol/L (ref 3.5–5.1)
Sodium: 140 mmol/L (ref 135–145)
TCO2: 22 mmol/L (ref 0–100)

## 2014-12-11 LAB — CSF IGG: IGG CSF: 5.7 mg/dL (ref 0.8–7.7)

## 2014-12-11 MED ORDER — ACD FORMULA A 0.73-2.45-2.2 GM/100ML VI SOLN
500.0000 mL | Status: DC
  Administered 2014-12-11: 500 mL via INTRAVENOUS
  Filled 2014-12-11: qty 500

## 2014-12-11 MED ORDER — SODIUM CHLORIDE 0.9 % IV SOLN
4.0000 g | Freq: Once | INTRAVENOUS | Status: DC
  Filled 2014-12-11: qty 40

## 2014-12-11 MED ORDER — FUROSEMIDE 10 MG/ML IJ SOLN
40.0000 mg | Freq: Once | INTRAMUSCULAR | Status: AC
  Administered 2014-12-11: 40 mg via INTRAVENOUS
  Filled 2014-12-11: qty 4

## 2014-12-11 MED ORDER — HEPARIN SODIUM (PORCINE) 1000 UNIT/ML IJ SOLN
1000.0000 [IU] | Freq: Once | INTRAMUSCULAR | Status: DC
  Filled 2014-12-11: qty 1

## 2014-12-11 MED ORDER — CALCIUM CARBONATE ANTACID 500 MG PO CHEW
CHEWABLE_TABLET | ORAL | Status: AC
  Filled 2014-12-11: qty 4

## 2014-12-11 MED ORDER — ACETAMINOPHEN 325 MG PO TABS: 650.0000 mg | ORAL_TABLET | ORAL | Status: DC | PRN

## 2014-12-11 MED ORDER — DIPHENHYDRAMINE HCL 25 MG PO CAPS: 25.0000 mg | ORAL_CAPSULE | Freq: Four times a day (QID) | ORAL | Status: DC | PRN

## 2014-12-11 MED ORDER — CALCIUM CARBONATE ANTACID 500 MG PO CHEW
2.0000 | CHEWABLE_TABLET | ORAL | Status: AC
  Administered 2014-12-11 (×2): 400 mg via ORAL

## 2014-12-11 NOTE — Progress Notes (Signed)
UR complete.  Jakyah Bradby RN, MSN 

## 2014-12-11 NOTE — Progress Notes (Signed)
Spoke with hemodialysis nurse and pt is going to be transferred for her 1/28 scheduled appointment. Hemo has been busy and treatment got pushed to today. Suzy Bouchard E, RN 12/11/2014 3:08 AM

## 2014-12-11 NOTE — Progress Notes (Signed)
Pt was able to ambulate around unit about 450ft.  Steady gait, standby assist. Will continue to encourage ambulation. Pt denied SOB or dyspnea. Denied pain or discomfort.

## 2014-12-11 NOTE — Progress Notes (Signed)
PROGRESS NOTE  Jessica Cisneros KCL:[id number] DOB: [birth year] DOA: 12/03/2014 PCP: Pcp Not In System  HPI: Jessica Cisneros is an 39 y.o. White female presented to the ER complaining of numbness and tingling starting at her genital region, then hip and progressing cephalad and now on her chest.Her legs and arms are now also involved. She is now s/p IV steroids for 5 days without improvement, PLEX started per neurology 1/27   Subjective - no appreciable improvement with plasmapheresis after 1 session, continues to have sensation changes upper and lower extremity, only complaint today is continuing to feel like her hands and feet feeling "puffy"  Assessment/Plan: Paresthesia/transverse myelitis: - MRI showing evidence of acute demyelination in C4-C6. - Neurology following - LP showing normal protein and glucose level and normal cell count, overall unremarkable - TSH and B-12 within normal limits, RPR nonreactive, ANA negative, HIV nonreactive, Lyme titer negative, - Low vitamin D level, started on vitamin D supplement. - PT/OT consult: recommend home health PT and 24 hour follow - Started on IV Solu Medrol 1 g daily - completed course 1/26 without much improvement - Given no improvement of patient's symptoms, she was started on plasmapheresis 1/27 as per neurology recommendation for 5 treatments, IR achieved access 1/26, today will be session 3.   Fluid overload - Likely due to fluid retention after steroid treatment, IVF - Steroid regimen complete, IV fluids stopped, encourage ambulation - Seems to be up 30 lbs, doubt all weights accurate, IV Lasix today  - Continue to monitor weights  Low vitamin D - Continue with supplement.  Leukocytosis - This is most likely related to her steroids - Stable, improving  Bacteriuria, symptomatic - On IV Rocephin for UTI treatment started 1/25 - Urine cultures not helpful, discontinued Ceftriaxone 1/29  Morbid obesity - BMI of 51 - Met with a  dietician on 1/26, they "expect good compliance" with their recommendations  Incidental finding of thyroid nodule - Patient will need ultrasound-guided thyroid fine-needle biopsy as an outpatient, was discussed with [name] Volanda Napoleon (patient's primary M.D. [place]) - TSH and free T4 within normal limits   DVT Prophylaxis:  Heparin injection   Code Status: Full Family Communication: Family at bedside Disposition Plan: Remain inpatient, home when ready   Consultants:  Neurology  IR  Procedures:  MRI brain 12/04/14: Normal MRI of the brain without without contrast  MRI C spine 12/04/14: No MR findings of demyelination in the thoracic spinal cord.   MRI thoracic spine 12/04/14: Mild degenerative change of thoracic spine without canal stenosis or neural foraminal narrowing.  Plasmapheresis starting 12/09/14  Antibiotics: Ceftriaxone 1/25 >> 1/29  Objective: Filed Vitals:   12/11/14 0515 12/11/14 0530 12/11/14 0540 12/11/14 1017  BP: 132/64 118/63 128/60 134/59  Pulse: 86 87 87 98  Temp: 98.1 F (36.7 C) 97.9 F (36.6 C)  98.2 F (36.8 C)  TempSrc: Oral Oral  Oral  Resp: '21 20 18 18  ' Height:      Weight:      SpO2: 100% 100%  100%    Intake/Output Summary (Last 24 hours) at 12/11/14 1228 Last data filed at 12/11/14 0900  Gross per 24 hour  Intake    240 ml  Output      0 ml  Net    240 ml   Filed Weights   12/09/14 1500 12/09/14 1639 12/11/14 0500  Weight: 146.3 kg (322 lb 8.5 oz) 147.5 kg (325 lb 2.9 oz) 150 kg (330 lb  11 oz)   Exam: General: Well developed, well nourished, NAD, sitting in chair watching tv, comfortable Neck: Supple, no JVD, no masses  Cardiovascular: RRR, S1 S2 auscultated, no rubs, murmurs or gallops.   Respiratory: Clear to auscultation bilaterally with equal chest rise  Extremities: warm dry without cyanosis clubbing, some non-pitting edema.  Neuro: AAOx3 Psych: Normal affect and demeanor with intact judgement and  insight   Data Reviewed: Basic Metabolic Panel:  Recent Labs Lab 12/05/14 0446 12/06/14 0414 12/07/14 0445 12/09/14 1506 12/11/14 0359  NA 138 138 141 140 140  K 4.0 4.2 4.8 3.9 3.6  CL 107 106 107 101 101  CO2 '23 23 23  ' --   --   GLUCOSE 185* 170* 147* 184* 108*  BUN '8 11 17 19 16  ' CREATININE 0.80 0.73 0.74 0.90 0.60  CALCIUM 9.2 9.2 9.1  --   --    CBC:  Recent Labs Lab 12/05/14 0446 12/06/14 0414 12/07/14 0445 12/09/14 1506 12/09/14 1510 12/11/14 0359  WBC 9.6 18.8* 15.8*  --  14.1*  --   HGB 11.3* 10.3* 10.4* 11.2* 10.3* 10.2*  HCT 36.4 33.7* 32.4* 33.0* 32.4* 30.0*  MCV 77.0* 77.8* 76.8*  --  75.7*  --   PLT 407* 415* 403*  --  386  --    Recent Results (from the past 240 hour(s))  CSF culture     Status: None   Collection Time: 12/04/14 12:32 PM  Result Value Ref Range Status   Specimen Description CSF  Final   Special Requests CSF FLUID NO2 2.5ML  Final   Gram Stain   Final    CYTOSPIN NO WBC SEEN NO ORGANISMS SEEN Performed at [facility] Performed at [place]   Final    NO GROWTH 3 DAYS Performed at [facility]    Report Status 12/08/2014 FINAL  Final  Gram stain     Status: None   Collection Time: 12/04/14 12:32 PM  Result Value Ref Range Status   Specimen Description CSF  Final   Special Requests NONE  Final   Gram Stain   Final    WBC PRESENT, PREDOMINANTLY MONONUCLEAR NO ORGANISMS SEEN CYTOSPIN SLIDE    Report Status 12/04/2014 FINAL  Final  Culture, Urine     Status: None   Collection Time: 12/06/14 11:09 PM  Result Value Ref Range Status   Specimen Description URINE, CLEAN CATCH  Final   Special Requests NONE  Final   Colony Count   Final    85,000 COLONIES/ML Performed at [employer] [facility]   Final    Multiple bacterial morphotypes present, none predominant. Suggest appropriate recollection if clinically indicated. Performed at [facility]    Report Status  12/08/2014 FINAL  Final     Studies: No results found.  Scheduled Meds: . calcium carbonate      . cefTRIAXone (ROCEPHIN)  IV  2 g Intravenous Q24H  . furosemide  40 mg Intravenous Once  . heparin subcutaneous  5,000 Units Subcutaneous 3 times per day  . nystatin   Topical BID  . pantoprazole  40 mg Oral Daily  . Vitamin D (Ergocalciferol)  50,000 Units Oral Q7 days   Continuous Infusions:   Active Problems:   Paresthesia   PCOS (polycystic ovarian syndrome)   Obesity   Low serum vitamin D   Transverse myelitis   Paresthesias  Caroline E. Howell-Methvin, PA-S   Suesan Mohrmann  Karlyne Greenspan, MD [facility] [phone]   If 7PM-7AM, please contact night-coverage at [email or web address], password [facility] 12/11/2014, 12:28 PM  LOS: 8 days

## 2014-12-11 NOTE — Progress Notes (Signed)
Pt ambulated from the bathroom to the chair, standby assist gait steady. She denied pain or discomfort. Call bell within reach. Will continue to monitor.

## 2014-12-11 NOTE — Progress Notes (Signed)
Subjective: S/p 3 treatments  Exam: Filed Vitals:   12/11/14 1017  BP: 134/59  Pulse: 98  Temp: 98.2 F (36.8 C)  Resp: 18   Gen: In bed, NAD MS: awake, alert, interactive and appropriate [id number], EOMI Motor: 5/5 thoruhgout Sensory: decreased to temp to the upper shoulders.    Impression: 40 yo F with transverse myelitis.  With no response to IV steroids, have begun plasma exchange. She will get total of 5 treatments.   Recommendations: 1) PLEX, s/p 3 tx, could do 4th and 5th as outpatient.  2) will continue to follow.   Ritta Slot, MD [facility] [phone]  If 7pm- 7am, please page neurology on call as listed in AMION.

## 2014-12-12 LAB — RETICULOCYTES
RBC.: 3.93 MIL/uL (ref 3.87–5.11)
RETIC COUNT ABSOLUTE: 59 10*3/uL (ref 19.0–186.0)
Retic Ct Pct: 1.5 % (ref 0.4–3.1)

## 2014-12-12 LAB — POCT I-STAT, CHEM 8
BUN: 9 mg/dL (ref 6–23)
CALCIUM ION: 1.22 mmol/L (ref 1.12–1.23)
Chloride: 100 mmol/L (ref 96–112)
Creatinine, Ser: 0.7 mg/dL (ref 0.50–1.10)
Glucose, Bld: 165 mg/dL — ABNORMAL HIGH (ref 70–99)
HEMATOCRIT: 30 % — AB (ref 36.0–46.0)
Hemoglobin: 10.2 g/dL — ABNORMAL LOW (ref 12.0–15.0)
Potassium: 4 mmol/L (ref 3.5–5.1)
SODIUM: 140 mmol/L (ref 135–145)
TCO2: 22 mmol/L (ref 0–100)

## 2014-12-12 LAB — CBC
HCT: 29.6 % — ABNORMAL LOW (ref 36.0–46.0)
Hemoglobin: 9.4 g/dL — ABNORMAL LOW (ref 12.0–15.0)
MCH: 23.9 pg — ABNORMAL LOW (ref 26.0–34.0)
MCHC: 31.8 g/dL (ref 30.0–36.0)
MCV: 75.3 fL — AB (ref 78.0–100.0)
PLATELETS: 271 10*3/uL (ref 150–400)
RBC: 3.93 MIL/uL (ref 3.87–5.11)
RDW: 15.6 % — ABNORMAL HIGH (ref 11.5–15.5)
WBC: 16.2 10*3/uL — ABNORMAL HIGH (ref 4.0–10.5)

## 2014-12-12 MED ORDER — ACD FORMULA A 0.73-2.45-2.2 GM/100ML VI SOLN
Status: AC
  Administered 2014-12-12: 10:00:00
  Filled 2014-12-12: qty 500

## 2014-12-12 MED ORDER — CALCIUM CARBONATE ANTACID 500 MG PO CHEW
2.0000 | CHEWABLE_TABLET | ORAL | Status: AC
  Administered 2014-12-12: 400 mg via ORAL
  Filled 2014-12-12: qty 1

## 2014-12-12 MED ORDER — ACD FORMULA A 0.73-2.45-2.2 GM/100ML VI SOLN
500.0000 mL | Status: DC
  Filled 2014-12-12: qty 500

## 2014-12-12 MED ORDER — ACETAMINOPHEN 325 MG PO TABS: 650.0000 mg | ORAL_TABLET | ORAL | Status: DC | PRN

## 2014-12-12 MED ORDER — SODIUM CHLORIDE 0.9 % IV SOLN
INTRAVENOUS | Status: AC
  Administered 2014-12-12 (×3): via INTRAVENOUS_CENTRAL
  Filled 2014-12-12 (×3): qty 200

## 2014-12-12 MED ORDER — FUROSEMIDE 20 MG PO TABS: 20.0000 mg | ORAL_TABLET | Freq: Every day | ORAL | Status: DC

## 2014-12-12 MED ORDER — ACD FORMULA A 0.73-2.45-2.2 GM/100ML VI SOLN
Status: AC
  Administered 2014-12-12: 09:00:00
  Filled 2014-12-12: qty 500

## 2014-12-12 MED ORDER — HEPARIN SODIUM (PORCINE) 1000 UNIT/ML IJ SOLN
1000.0000 [IU] | Freq: Once | INTRAMUSCULAR | Status: DC
  Filled 2014-12-12: qty 1

## 2014-12-12 MED ORDER — CALCIUM CARBONATE ANTACID 500 MG PO CHEW
CHEWABLE_TABLET | ORAL | Status: AC
Start: 2014-12-12 — End: 2014-12-12
  Filled 2014-12-12: qty 2

## 2014-12-12 MED ORDER — DIPHENHYDRAMINE HCL 25 MG PO CAPS: 25.0000 mg | ORAL_CAPSULE | Freq: Four times a day (QID) | ORAL | Status: DC | PRN

## 2014-12-12 MED ORDER — POTASSIUM CHLORIDE ER 10 MEQ PO TBCR: 10.0000 meq | EXTENDED_RELEASE_TABLET | Freq: Every day | ORAL | Status: DC

## 2014-12-12 MED ORDER — SODIUM CHLORIDE 0.9 % IV SOLN
4.0000 g | Freq: Once | INTRAVENOUS | Status: AC
  Administered 2014-12-12: 4 g via INTRAVENOUS
  Filled 2014-12-12: qty 40

## 2014-12-12 NOTE — Discharge Instructions (Signed)
Follow with Pcp Not In System in 5-7 days ° °Please get a complete blood count and chemistry panel checked by your Primary MD at your next visit, and again as instructed by your Primary MD. Please get your medications reviewed and adjusted by your Primary MD. ° °Please request your Primary MD to go over all Hospital Tests and Procedure/Radiological results at the follow up, please get all Hospital records sent to your Prim MD by signing hospital release before you go home. ° °If you had Pneumonia of Lung problems at the Hospital: °Please get a 2 view Chest X ray done in 6-8 weeks after hospital discharge or sooner if instructed by your Primary MD. ° °If you have Congestive Heart Failure: °Please call your Cardiologist or Primary MD anytime you have any of the following symptoms:  °1) 3 pound weight gain in 24 hours or 5 pounds in 1 week  °2) shortness of breath, with or without a dry hacking cough  °3) swelling in the hands, feet or stomach  °4) if you have to sleep on extra pillows at night in order to breathe ° °Follow cardiac low salt diet and 1.5 lit/day fluid restriction. ° °If you have diabetes °Accuchecks 4 times/day, Once in AM empty stomach and then before each meal. °Log in all results and show them to your primary doctor at your next visit. °If any glucose reading is under 80 or above 300 call your primary MD immediately. ° °If you have Seizure/Convulsions/Epilepsy: °Please do not drive, operate heavy machinery, participate in activities at heights or participate in high speed sports until you have seen by Primary MD or a Neurologist and advised to do so again. ° °If you had Gastrointestinal Bleeding: °Please ask your Primary MD to check a complete blood count within one week of discharge or at your next visit. Your endoscopic/colonoscopic biopsies that are pending at the time of discharge, will also need to followed by your Primary MD. ° °Get Medicines reviewed and adjusted. °Please take all your  medications with you for your next visit with your Primary MD ° °Please request your Primary MD to go over all hospital tests and procedure/radiological results at the follow up, please ask your Primary MD to get all Hospital records sent to his/her office. ° °If you experience worsening of your admission symptoms, develop shortness of breath, life threatening emergency, suicidal or homicidal thoughts you must seek medical attention immediately by calling 911 or calling your MD immediately  if symptoms less severe. ° °You must read complete instructions/literature along with all the possible adverse reactions/side effects for all the Medicines you take and that have been prescribed to you. Take any new Medicines after you have completely understood and accpet all the possible adverse reactions/side effects.  ° °Do not drive or operate heavy machinery when taking Pain medications.  ° °Do not take more than prescribed Pain, Sleep and Anxiety Medications ° °Special Instructions: If you have smoked or chewed Tobacco  in the last 2 yrs please stop smoking, stop any regular Alcohol  and or any Recreational drug use. ° °Wear Seat belts while driving. ° °Please note °You were cared for by a hospitalist during your hospital stay. If you have any questions about your discharge medications or the care you received while you were in the hospital after you are discharged, you can call the unit and asked to speak with the hospitalist on call if the hospitalist that took care of you is not available.   Once you are discharged, your primary care physician will handle any further medical issues. Please note that NO REFILLS for any discharge medications will be authorized once you are discharged, as it is imperative that you return to your primary care physician (or establish a relationship with a primary care physician if you do not have one) for your aftercare needs so that they can reassess your need for medications and monitor your  lab values.  You can reach the hospitalist office at phone [phone] or fax [phone]   If you do not have a primary care physician, you can call [phone] for a physician referral.  Activity: As tolerated with Full fall precautions use walker/cane & assistance as needed  Diet: regular  Disposition Home

## 2014-12-12 NOTE — Progress Notes (Signed)
Spoke with on-call  CM Sarah who gave information for pt to contact Medicaid today if she has transportation needs for dialysis on Monday at 11am. Transport has to be scheduled 48 hours in advance and pt was made aware.  Pt stated that she would rather her mother transport her home today and back for dialysis. Md notified.

## 2014-12-12 NOTE — Progress Notes (Signed)
Pt ambulated standby assist to the bathroom this morning. Steady gait. Neuro intact. No noted distress. Denied pain or discomfort. Pt transported via bed to dialysis center.

## 2014-12-13 LAB — OLIGOCLONAL BANDS, CSF + SERM

## 2014-12-13 NOTE — Care Management Note (Addendum)
    Page 1 of 1   12/13/2014     3:56:24 PM CARE MANAGEMENT NOTE 12/13/2014  Patient:  Jessica Cisneros, Jessica Cisneros   Account Number:  [id number]  Date Initiated:  12/08/2014  Documentation initiated by:  Elmer Bales  Subjective/Objective Assessment:   Patient was admitted with paresthesia.  Lives at home with spouse.     Action/Plan:   Will follow for discharge needs.   Anticipated DC Date:  12/12/2014   Anticipated DC Plan:           Choice offered to / List presented to:             Status of service:  Completed, signed off Medicare Important Message given?   (If response is "NO", the following Medicare IM given date fields will be blank) Date Medicare IM given:   Medicare IM given by:   Date Additional Medicare IM given:   Additional Medicare IM given by:    Discharge Disposition:  HOME/SELF CARE  Per UR Regulation:  Reviewed for med. necessity/level of care/duration of stay  If discussed at Long Length of Stay Meetings, dates discussed:    Comments:  12/13/14 15:00 Cm spoke with pt and gave her the number of the MEDICAID TRANSPORTATION [phone] despite pt stating her mother will be transportating her tomorow so pt has the number for future use.  Pt verbalized understanding she needs to call 48 hours in advance of an appointment for scheduling purposes. Pt does not meet criteria for HHPT (MEDICAID) and does not want to self pay.   No other CM needs were communicated. Freddy Jaksch, BSN, [place] [phone].  12/08/13 0930 Elmer Bales RN, MSN, CM- Spoke with attending MD, who states that arrangements have been made for patient to follow-up with her current neurologist in [place], [place] at discharge.

## 2014-12-13 NOTE — Discharge Summary (Signed)
Physician Discharge Summary  [name]:[id number] DOB: 1976-03-30 DOA: 12/03/2014  PCP: Pcp Not In System  Admit date: 12/03/2014 Discharge date: 12/13/2014  Time spent: 35 minutes  Recommendations for Outpatient Follow-up:  1. Follow up with PCP in [place] in 1-2 weeks 2. Follow up with Neurology, Dr. Clelia Croft in [place]  3. Follow up with dialysis center [facility] on Monday as scheduled  Discharge Diagnoses:  Active Problems:   Paresthesia   PCOS (polycystic ovarian syndrome)   Obesity   Low serum vitamin D   Transverse myelitis   Paresthesias   Edema   Urinary tract infectious disease  Discharge Condition: stable  Diet recommendation: heart healthy  Filed Weights   12/12/14 0500 12/12/14 0815 12/12/14 0954  Weight: 149.6 kg (329 lb 12.9 oz) 147.1 kg (324 lb 4.8 oz) 147.3 kg (324 lb 11.8 oz)   History of present illness:  Jessica Cisneros is an 39 y.o. female with benign PMH on no chronic meds but perhaps by virtue of hardly ever saw any physician, presents to the ER complaining of feeling numbness and tingling starting on her hip and progressing cephalad and now on her chest. She denied weakness, pain, blurry vision, stiffneck, fever, or chills. She has some HA, but no stiffneck. She denied any rash. She has 2 cats and no dog. No tick bite, no ill contact, and no distant travel. She drinks bottle water usually, and denied drug, alcohol or tobacco use. Evaluation in the ER showed normal serology. Neurology was consulted, recommended hospitalist to admit for further work up to include imaging. She was seen at [facility] ER x2. Her mother has Multiple Sclerosis. She has no weakness nor pain. It should be noted that her period has been irregular and that she has hirsutism. She did have 2 children and one miscarriage.  Hospital Course:  Paresthesia/transverse myelitis: - MRI showing evidence of acute demyelination in C4-C6. Neurology was consulted and followed patient  while hospitalized. She underwent LP which showed normal protein and glucose level and normal cell count, overall unremarkable. TSH and B-12 within normal limits, RPR nonreactive, ANA negative, HIV nonreactive, Lyme titer negative. She was initially started on IV Solumedrol which she completed the course on 1/26, without much clinical improvement. She was then started on plasmapheresis per neurology and a tunneled HD cath was placed by IR. Patient completed 3 daily cycles of plasmapheresis, cycle 4 on the day of discharge and will return in 2 days to complete the 5th treatment. Clinically she has remained stable, able to ambulate in the hallway with physical therapy. PT consulted and recommended HHPT which was arranged prior to patient's discharge per Case Manager. After plasmapheresis is done, per patient preference she will continue follow up with neurology in [place].  Fluid overload - Likely due to fluid retention after steroid treatment, IVF, responded to IV Lasix, will need 4 more days of low dose lasix with KCL. Advised to monitor weights. Low vitamin D - Continue with supplement. Leukocytosis- This is most likely related to her steroids Bacteriuria, symptomatic - On IV Rocephin for UTI treatment started 1/25, Urine cultures not helpful, discontinued Ceftriaxone 1/29 Morbid obesity - BMI of 51 Incidental finding of thyroid nodule - Patient will need ultrasound-guided thyroid fine-needle biopsy as an outpatient, was discussed with [facility] PA Clent Ridges (patient's primary M.D. Parnell) - TSH and free T4 within normal limits  Procedures:  Plasmapheresis x 4   Consultations:  Neurology  IR  Discharge Exam: Filed Vitals:  12/12/14 0954 12/12/14 1024 12/12/14 1355 12/12/14 1740  BP: 119/53 106/61 122/64 144/64  Pulse: 103 93 102 113  Temp: 98.2 F (36.8 C) 99 F (37.2 C) 97.9 F (36.6 C) 98.8 F (37.1 C)  TempSrc: Oral Oral Oral Oral  Resp: 20 20 20 20   Height:        Weight: 147.3 kg (324 lb 11.8 oz)     SpO2: 100% 100% 100% 100%   General: NAD Cardiovascular: RRR Respiratory: CTA biL  Discharge Instructions  Discharge Instructions    Diet - low sodium heart healthy    Complete by:  As directed      Discharge instructions    Complete by:  As directed   Follow with [facility], PA Clent Ridges in 7 days  To have US guided thyroid needle aspiration as out patient. - Follow-up with urology Dr. Arnetha Massy in 1-2 weeks as an outpatient.  Get CBC, CMP, checked  by Primary MD next visit.    Activity: As tolerated with Full fall precautions use walker/cane & assistance as needed   Disposition Home    Diet: Heart Healthy  , with feeding assistance and aspiration precautions as needed.  For Heart failure patients - Check your Weight same time everyday, if you gain over 2 pounds, or you develop in leg swelling, experience more shortness of breath or chest pain, call your Primary MD immediately. Follow Cardiac Low Salt Diet and 1.8 lit/day fluid restriction.   On your next visit with your primary care physician please Get Medicines reviewed and adjusted.   Please request your Prim.MD to go over all Hospital Tests and Procedure/Radiological results at the follow up, please get all Hospital records sent to your Prim MD by signing hospital release before you go home.   If you experience worsening of your admission symptoms, develop shortness of breath, life threatening emergency, suicidal or homicidal thoughts you must seek medical attention immediately by calling 911 or calling your MD immediately  if symptoms less severe.  You Must read complete instructions/literature along with all the possible adverse reactions/side effects for all the Medicines you take and that have been prescribed to you. Take any new Medicines after you have completely understood and accpet all the possible adverse reactions/side effects.   Do not drive, operating heavy  machinery, perform activities at heights, swimming or participation in water activities or provide baby sitting services if your were admitted for syncope or siezures until you have seen by Primary MD or a Neurologist and advised to do so again.  Do not drive when taking Pain medications.    Do not take more than prescribed Pain, Sleep and Anxiety Medications  Special Instructions: If you have smoked or chewed Tobacco  in the last 2 yrs please stop smoking, stop any regular Alcohol  and or any Recreational drug use.  Wear Seat belts while driving.   Please note  You were cared for by a hospitalist during your hospital stay. If you have any questions about your discharge medications or the care you received while you were in the hospital after you are discharged, you can call the unit and asked to speak with the hospitalist on call if the hospitalist that took care of you is not available. Once you are discharged, your primary care physician will handle any further medical issues. Please note that NO REFILLS for any discharge medications will be authorized once you are discharged, as it is imperative that you return to your  primary care physician (or establish a relationship with a primary care physician if you do not have one) for your aftercare needs so that they can reassess your need for medications and monitor your lab values.     Increase activity slowly    Complete by:  As directed             Medication List    TAKE these medications        furosemide 20 MG tablet  Commonly known as:  LASIX  Take 1 tablet (20 mg total) by mouth daily.     potassium chloride 10 MEQ tablet  Commonly known as:  K-DUR  Take 1 tablet (10 mEq total) by mouth daily.     Vitamin D (Ergocalciferol) 50000 UNITS Caps capsule  Commonly known as:  DRISDOL  Take 1 capsule (50,000 Units total) by mouth every 7 (seven) days.           Follow-up Information    Follow up with [facility].     Specialty:  General Practice   Why:  Please follow with PA Clent Ridges, regarding ultrasound-guided thyroid needle biopsy   Contact information:   [address] [place] [address] [phone]       Follow up with [facility], [facility] K, MD. Schedule an appointment as soon as possible for a visit in 1 week.   Specialty:  Neurology   Contact information:   [address] [facility] [place] [address] [phone]       The results of significant diagnostics from this hospitalization (including imaging, microbiology, ancillary and laboratory) are listed below for reference.    Significant Diagnostic Studies: Ct Chest W Contrast  12/08/2014   CLINICAL DATA:  Evaluate for lymphadenopathy, to assess for possibility of sarcoidosis. Initial encounter.  EXAM: CT CHEST WITH CONTRAST  TECHNIQUE: Multidetector CT imaging of the chest was performed during intravenous contrast administration.  CONTRAST:  OMNIPAQUE IOHEXOL 300 MG/ML  SOLN  COMPARISON:  Thoracic spine MRI performed 12/04/2014  FINDINGS: Mild bilateral dependent subsegmental atelectasis is noted. The lungs are otherwise clear. There is no evidence of focal airspace consolidation, pleural effusion or pneumothorax. No masses are seen.  The mediastinum is unremarkable in appearance. No mediastinal lymphadenopathy is seen. No pericardial effusion is identified. The great vessels are grossly unremarkable. The patient's right PICC is noted ending about the distal SVC.  A 2.3 cm heterogeneous mass is again seen arising at the posterior aspect of the left thyroid lobe, similar in appearance to the prior MRI. No axillary lymphadenopathy is seen. Visualized axillary nodes remain normal in size.  The visualized portions of the liver and spleen are unremarkable. The visualized portions of the adrenal glands and pancreas are within normal limits.  No acute osseous abnormalities are identified.  IMPRESSION: 1. No  evidence of lymphadenopathy within the chest. 2. Mild bilateral dependent subsegmental atelectasis noted; lungs otherwise clear. 3. 2.3 cm heterogeneous mass again noted arising at the posterior aspect of the left thyroid lobe, as on the recent MRI. Recommend further evaluation with thyroid ultrasound. If patient is clinically hyperthyroid, consider nuclear medicine thyroid uptake and scan.   Electronically Signed   By: Roanna Raider M.D.   On: 12/08/2014 03:10   Mr Laqueta Jean VH Contrast  12/04/2014   CLINICAL DATA:  Numbness and tingling in hips progressing to chest bilaterally over 3 weeks.  EXAM: MRI HEAD WITHOUT AND WITH CONTRAST  MRI CERVICAL SPINE WITHOUT AND  WITH CONTRAST  MRI THORACIC SPINE WITHOUT AND WITH CONTRAST  TECHNIQUE: Multiplanar, multiecho pulse sequences of the brain and surrounding structures, cervical and thoracic spine, to include the craniocervical junction, were obtained without and with intravenous contrast.  CONTRAST:  20mL MULTIHANCE GADOBENATE DIMEGLUMINE 529 MG/ML IV SOLN chest radiograph November 19, 2014 and lumbar spine radiographs November 19, 2014  COMPARISON:  None.  FINDINGS: MRI HEAD FINDINGS  The ventricles and sulci are normal for patient's age. No abnormal parenchymal signal, mass lesions, mass effect. No abnormal parenchymal enhancement. No reduced diffusion to suggest acute ischemia. No susceptibility artifact to suggest hemorrhage.  No abnormal extra-axial fluid collections. No extra-axial masses nor leptomeningeal enhancement. Normal major intracranial vascular flow voids seen at the skull base.  Ocular globes and orbital contents are unremarkable though not tailored for evaluation. No abnormal sellar expansion. Visualized paranasal sinuses and mastoid air cells are well-aerated. No suspicious calvarial bone marrow signal. No abnormal sellar expansion. Craniocervical junction maintained.  MRI CERVICAL SPINE FINDINGS  Expansile cervical spinal cord T2 bright, T1 hypointense  lesion with homogeneous enhancement from C4 through C6, measuring 4.1 cm in craniocaudad dimension, with relative sparing of the lateral columns. No additional cervical spinal cord lesions identified. No syrinx. No abnormal leptomeningeal nor epidural enhancement.  Cervical vertebral bodies intact, straightened cervical lordosis. 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 Intervertebral discs demonstrate generally normal morphology, slightly decreased T2 signal consistent with desiccation. Mild subacute discogenic endplate changes C3-4, C5-6 and a lesser extent at C6-7 without STIR signal abnormality to suggest acute osseous process. No abnormal osseous or intradiscal enhancement.  2.3 x 1.9 cm dominant LEFT thyroid nodule.  Level by level evaluation:  C2-3:  No disc bulge, canal stenosis or neural foraminal narrowing.  C3-4: 2 mm broad-based disc bulge, uncovertebral hypertrophy and mild LEFT facet arthropathy. Mild canal stenosis, moderate to severe LEFT neural foraminal narrowing.  C4-5: 1-2 mm broad-based disc bulge, uncovertebral hypertrophy and mild facet arthropathy. Mild canal stenosis, mild neural foraminal narrowing.  C5-6: 1-2 mm broad-based disc bulge, uncovertebral hypertrophy. Mild canal stenosis. Mild to moderate LEFT neural foraminal narrowing.  C6-7: Annular bulging, uncovertebral hypertrophy without canal stenosis or neural foraminal narrowing.  C7-T1: No disc bulge, canal stenosis nor neural foraminal narrowing.  MRI THORACIC SPINE FINDINGS  Thoracic vertebral bodies and posterior elements intact aligned 1 maintenance of thoracic kyphosis. Mild T7-8 disc height loss, with decreased T2 signal suggesting mild desiccation. Mild acute enhancing discogenic endplate change at T5-6, subacute discogenic endplate changes T7-8. No STIR signal abnormality to suggest fracture. No abnormal osseous or intradiscal enhancement.  Thoracic spinal cord appears normal morphology and signal characteristics  the conus medullaris which terminates at T12-L1. No abnormal cord, leptomeningeal or epidural enhancement. Prevertebral and paraspinal soft tissues are normal.  Tiny T5-6 central disc protrusion. 2 mm RIGHT T7 suspected disc extrusion partially characterized due to slice thickness. No canal stenosis or neural foraminal narrowing at any thoracic level.  IMPRESSION: MRI HEAD  Normal MRI of the brain without without contrast.  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 Neural foraminal narrowing C3-4 through C5-6: Moderate to severe on the LEFT at C3-4.  2.3 cm LEFT thyroid nodule for which follow up thyroid sonogram is recommended on a nonemergent basis.  MRI THORACIC SPINE  No MR findings of demyelination  in the thoracic spinal cord.  Mild degenerative change of thoracic spine without canal stenosis or neural foraminal narrowing.   Electronically Signed   By: Awilda Metro   On: 12/04/2014 04:49   Mr Cervical Spine W Wo Contrast  12/04/2014   CLINICAL DATA:  Numbness and tingling in hips progressing to chest bilaterally over 3 weeks.  EXAM: MRI HEAD WITHOUT AND WITH CONTRAST  MRI CERVICAL SPINE WITHOUT AND WITH CONTRAST  MRI THORACIC SPINE WITHOUT AND WITH CONTRAST  TECHNIQUE: Multiplanar, multiecho pulse sequences of the brain and surrounding structures, cervical and thoracic spine, to include the craniocervical junction, were obtained without and with intravenous contrast.  CONTRAST:  20mL MULTIHANCE GADOBENATE DIMEGLUMINE 529 MG/ML IV SOLN chest radiograph November 19, 2014 and lumbar spine radiographs November 19, 2014  COMPARISON:  None.  FINDINGS: MRI HEAD FINDINGS  The ventricles and sulci are normal for patient's age. No abnormal parenchymal signal, mass lesions, mass effect. No  abnormal parenchymal enhancement. No reduced diffusion to suggest acute ischemia. No susceptibility artifact to suggest hemorrhage.  No abnormal extra-axial fluid collections. No extra-axial masses nor leptomeningeal enhancement. Normal major intracranial vascular flow voids seen at the skull base.  Ocular globes and orbital contents are unremarkable though not tailored for evaluation. No abnormal sellar expansion. Visualized paranasal sinuses and mastoid air cells are well-aerated. No suspicious calvarial bone marrow signal. No abnormal sellar expansion. Craniocervical junction maintained.  MRI CERVICAL SPINE FINDINGS  Expansile cervical spinal cord T2 bright, T1 hypointense lesion with homogeneous enhancement from C4 through C6, measuring 4.1 cm in craniocaudad dimension, with relative sparing of the lateral columns. No additional cervical spinal cord lesions identified. No syrinx. No abnormal leptomeningeal nor epidural enhancement.  Cervical vertebral bodies intact, straightened cervical lordosis. 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 Intervertebral discs demonstrate generally normal morphology, slightly decreased T2 signal consistent with desiccation. Mild subacute discogenic endplate changes C3-4, C5-6 and a lesser extent at C6-7 without STIR signal abnormality to suggest acute osseous process. No abnormal osseous or intradiscal enhancement.  2.3 x 1.9 cm dominant LEFT thyroid nodule.  Level by level evaluation:  C2-3:  No disc bulge, canal stenosis or neural foraminal narrowing.  C3-4: 2 mm broad-based disc bulge, uncovertebral hypertrophy and mild LEFT facet arthropathy. Mild canal stenosis, moderate to severe LEFT neural foraminal narrowing.  C4-5: 1-2 mm broad-based disc bulge, uncovertebral hypertrophy and mild facet arthropathy. Mild canal stenosis, mild neural foraminal narrowing.  C5-6: 1-2 mm broad-based disc bulge, uncovertebral hypertrophy. Mild canal stenosis. Mild to moderate LEFT  neural foraminal narrowing.  C6-7: Annular bulging, uncovertebral hypertrophy without canal stenosis or neural foraminal narrowing.  C7-T1: No disc bulge, canal stenosis nor neural foraminal narrowing.  MRI THORACIC SPINE FINDINGS  Thoracic vertebral bodies and posterior elements intact aligned 1 maintenance of thoracic kyphosis. Mild T7-8 disc height loss, with decreased T2 signal suggesting mild desiccation. Mild acute enhancing discogenic endplate change at T5-6, subacute discogenic endplate changes T7-8. No STIR signal abnormality to suggest fracture. No abnormal osseous or intradiscal enhancement.  Thoracic spinal cord appears normal morphology and signal characteristics the conus medullaris which terminates at T12-L1. No abnormal cord, leptomeningeal or epidural enhancement. Prevertebral and paraspinal soft tissues are normal.  Tiny T5-6 central disc protrusion. 2 mm RIGHT T7 suspected disc extrusion partially characterized due to slice thickness. No canal stenosis or neural foraminal narrowing at any thoracic level.  IMPRESSION: MRI HEAD  Normal MRI of the brain without without contrast.  MRI CERVICAL SPINE  Expansile  enhancing solitary cervical spinal cord lesion spanning C4 through C6 with imaging characteristics of acute demyelination.  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 Neural foraminal narrowing C3-4 through C5-6: Moderate to severe on the LEFT at C3-4.  2.3 cm LEFT thyroid nodule for which follow up thyroid sonogram is recommended on a nonemergent basis.  MRI THORACIC SPINE  No MR findings of demyelination in the thoracic spinal cord.  Mild degenerative change of thoracic spine without canal stenosis or neural foraminal narrowing.   Electronically Signed   By: Awilda Metro   On: 12/04/2014 04:49   Mr Thoracic Spine W Wo Contrast  12/04/2014   CLINICAL DATA:  Numbness and tingling in hips progressing to chest  bilaterally over 3 weeks.  EXAM: MRI HEAD WITHOUT AND WITH CONTRAST  MRI CERVICAL SPINE WITHOUT AND WITH CONTRAST  MRI THORACIC SPINE WITHOUT AND WITH CONTRAST  TECHNIQUE: Multiplanar, multiecho pulse sequences of the brain and surrounding structures, cervical and thoracic spine, to include the craniocervical junction, were obtained without and with intravenous contrast.  CONTRAST:  20mL MULTIHANCE GADOBENATE DIMEGLUMINE 529 MG/ML IV SOLN chest radiograph November 19, 2014 and lumbar spine radiographs November 19, 2014  COMPARISON:  None.  FINDINGS: MRI HEAD FINDINGS  The ventricles and sulci are normal for patient's age. No abnormal parenchymal signal, mass lesions, mass effect. No abnormal parenchymal enhancement. No reduced diffusion to suggest acute ischemia. No susceptibility artifact to suggest hemorrhage.  No abnormal extra-axial fluid collections. No extra-axial masses nor leptomeningeal enhancement. Normal major intracranial vascular flow voids seen at the skull base.  Ocular globes and orbital contents are unremarkable though not tailored for evaluation. No abnormal sellar expansion. Visualized paranasal sinuses and mastoid air cells are well-aerated. No suspicious calvarial bone marrow signal. No abnormal sellar expansion. Craniocervical junction maintained.  MRI CERVICAL SPINE FINDINGS  Expansile cervical spinal cord T2 bright, T1 hypointense lesion with homogeneous enhancement from C4 through C6, measuring 4.1 cm in craniocaudad dimension, with relative sparing of the lateral columns. No additional cervical spinal cord lesions identified. No syrinx. No abnormal leptomeningeal nor epidural enhancement.  Cervical vertebral bodies intact, straightened cervical lordosis. 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 Intervertebral discs demonstrate generally normal morphology, slightly decreased T2 signal consistent with desiccation. Mild subacute discogenic endplate changes C3-4, C5-6 and a lesser  extent at C6-7 without STIR signal abnormality to suggest acute osseous process. No abnormal osseous or intradiscal enhancement.  2.3 x 1.9 cm dominant LEFT thyroid nodule.  Level by level evaluation:  C2-3:  No disc bulge, canal stenosis or neural foraminal narrowing.  C3-4: 2 mm broad-based disc bulge, uncovertebral hypertrophy and mild LEFT facet arthropathy. Mild canal stenosis, moderate to severe LEFT neural foraminal narrowing.  C4-5: 1-2 mm broad-based disc bulge, uncovertebral hypertrophy and mild facet arthropathy. Mild canal stenosis, mild neural foraminal narrowing.  C5-6: 1-2 mm broad-based disc bulge, uncovertebral hypertrophy. Mild canal stenosis. Mild to moderate LEFT neural foraminal narrowing.  C6-7: Annular bulging, uncovertebral hypertrophy without canal stenosis or neural foraminal narrowing.  C7-T1: No disc bulge, canal stenosis nor neural foraminal narrowing.  MRI THORACIC SPINE FINDINGS  Thoracic vertebral bodies and posterior elements intact aligned 1 maintenance of thoracic kyphosis. Mild T7-8 disc height loss, with decreased T2 signal suggesting mild desiccation. Mild acute enhancing discogenic endplate change at T5-6, subacute discogenic endplate changes T7-8. No STIR signal abnormality to suggest fracture. No abnormal osseous or intradiscal enhancement.  Thoracic spinal cord appears  normal morphology and signal characteristics the conus medullaris which terminates at T12-L1. No abnormal cord, leptomeningeal or epidural enhancement. Prevertebral and paraspinal soft tissues are normal.  Tiny T5-6 central disc protrusion. 2 mm RIGHT T7 suspected disc extrusion partially characterized due to slice thickness. No canal stenosis or neural foraminal narrowing at any thoracic level.  IMPRESSION: MRI HEAD  Normal MRI of the brain without without contrast.  MRI CERVICAL SPINE  Expansile enhancing solitary cervical spinal cord lesion spanning C4 through C6 with imaging characteristics of acute  demyelination.  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 Neural foraminal narrowing C3-4 through C5-6: Moderate to severe on the LEFT at C3-4.  2.3 cm LEFT thyroid nodule for which follow up thyroid sonogram is recommended on a nonemergent basis.  MRI THORACIC SPINE  No MR findings of demyelination in the thoracic spinal cord.  Mild degenerative change of thoracic spine without canal stenosis or neural foraminal narrowing.   Electronically Signed   By: Awilda Metro   On: 12/04/2014 04:49   US Soft Tissue Head/neck  12/08/2014   CLINICAL DATA:  Left thyroid nodule  EXAM: THYROID ULTRASOUND  TECHNIQUE: Ultrasound examination of the thyroid gland and adjacent soft tissues was performed.  COMPARISON:  None.  FINDINGS: Right thyroid lobe  Measurements: 5.0 x 1.4 x 1.6 cm. 6 mm isoechoic nodule in the lower pole. Adjacent 9 mm solid nodule in the lower pole.  Left thyroid lobe  Measurements: 5.9 x 2.7 x 1.7 cm. Interpolar region nodule measures 1.7 x 0.9 x 1.4 cm. Dominant solid left upper pole nodule measures 3.0 x 2.0 x 2.0 cm. It is homogeneous and isoechoic.  Isthmus  Thickness: 3 mm.  No nodules visualized.  Lymphadenopathy  None visualized.  IMPRESSION: Bilateral nodules. Dominant left upper pole nodule measures 3.0 cm. Findings meet consensus criteria for biopsy. Ultrasound-guided fine needle aspiration should be considered, as per the consensus statement: Management of Thyroid Nodules Detected at [place]: Society of Radiologists in Ultrasound Consensus Conference Statement. Radiology 2005; [id number].   Electronically Signed   By: Maryclare Bean M.D.   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 The patient requires placement of a tunneled central catheter for plasma exchange.  EXAM: TUNNELED CENTRAL VENOUS HEMODIALYSIS/PHERESIS CATHETER PLACEMENT WITH  ULTRASOUND AND FLUOROSCOPIC GUIDANCE  ANESTHESIA/SEDATION: 2.0 mg IV Versed; 100 mcg IV Fentanyl.  Total Moderate Sedation Time  20 minutes.  MEDICATIONS: 3 g IV Ancef. As antibiotic prophylaxis, Ancef was ordered pre-procedure and administered intravenously within one hour of incision.  FLUOROSCOPY TIME:  48 seconds.  PROCEDURE: The procedure, risks, benefits, and alternatives were explained to the patient. Questions regarding the procedure were encouraged and answered. The patient understands and consents to the procedure.  The right neck and chest were prepped with chlorhexidine in a sterile fashion, and a sterile drape was applied covering the operative field. Maximum barrier sterile technique with sterile gowns and gloves were used for the procedure. Local anesthesia was provided with 1% lidocaine. A time-out was performed prior to the procedure.  Ultrasound was used to confirm patency of the right internal jugular vein. After creating a small venotomy incision, a 21 gauge needle was advanced into the right internal jugular vein under direct, real-time ultrasound guidance. Ultrasound image documentation was performed. After securing guidewire access, an 8 Fr dilator was placed. A J-wire  was kinked to measure appropriate catheter length.  A Bard Hemosplit tunneled hemodialysis catheter measuring 23 cm from tip to cuff was chosen for placement. This was tunneled in a retrograde fashion from the chest wall to the venotomy incision.  At the venotomy, serial dilatation was performed and a 16 Fr peel-away sheath was placed over a guidewire. The catheter was then placed through the sheath and the sheath removed. Final catheter positioning was confirmed and documented with a fluoroscopic spot image. The catheter was aspirated, flushed with saline, and injected with appropriate volume heparin dwells.  The venotomy incision was closed with subcutaneous 3-0 Monocryl and subcuticular 4-0 Vicryl. Dermabond was applied to  the incision. The catheter exit site was secured with 0-Prolene retention sutures.  COMPLICATIONS: None.  No pneumothorax.  FINDINGS: After catheter placement, the tips lie in the right atrium. The catheter aspirates normally and is ready for immediate use.  IMPRESSION: Placement of tunneled hemodialysis/pheresis catheter via the right internal jugular vein. The catheter tips lie in the right atrium. The catheter is ready for immediate use.   Electronically Signed   By: Irish Lack M.D.   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 The patient requires placement of a tunneled central catheter for plasma exchange.  EXAM: TUNNELED CENTRAL VENOUS HEMODIALYSIS/PHERESIS CATHETER PLACEMENT WITH ULTRASOUND AND FLUOROSCOPIC GUIDANCE  ANESTHESIA/SEDATION: 2.0 mg IV Versed; 100 mcg IV Fentanyl.  Total Moderate Sedation Time  20 minutes.  MEDICATIONS: 3 g IV Ancef. As antibiotic prophylaxis, Ancef was ordered pre-procedure and administered intravenously within one hour of incision.  FLUOROSCOPY TIME:  48 seconds.  PROCEDURE: The procedure, risks, benefits, and alternatives were explained to the patient. Questions regarding the procedure were encouraged and answered. The patient understands and consents to the procedure.  The right neck and chest were prepped with chlorhexidine in a sterile fashion, and a sterile drape was applied covering the operative field. Maximum barrier sterile technique with sterile gowns and gloves were used for the procedure. Local anesthesia was provided with 1% lidocaine. A time-out was performed prior to the procedure.  Ultrasound was used to confirm patency of the right internal jugular vein. After creating a small venotomy incision, a 21 gauge needle was advanced into the right internal jugular vein under direct, real-time ultrasound guidance. Ultrasound image documentation was performed. After securing  guidewire access, an 8 Fr dilator was placed. A J-wire was kinked to measure appropriate catheter length.  A Bard Hemosplit tunneled hemodialysis catheter measuring 23 cm from tip to cuff was chosen for placement. This was tunneled in a retrograde fashion from the chest wall to the venotomy incision.  At the venotomy, serial dilatation was performed and a 16 Fr peel-away sheath was placed over a guidewire. The catheter was then placed through the sheath and the sheath removed. Final catheter positioning was confirmed and documented with a fluoroscopic spot image. The catheter was aspirated, flushed with saline, and injected with appropriate volume heparin dwells.  The venotomy incision was closed with subcutaneous 3-0 Monocryl and subcuticular 4-0 Vicryl. Dermabond was applied to the incision. The catheter exit site was secured with 0-Prolene retention sutures.  COMPLICATIONS: None.  No pneumothorax.  FINDINGS: After catheter placement, the tips lie in the right atrium. The catheter aspirates normally and is ready for immediate use.  IMPRESSION: Placement of tunneled hemodialysis/pheresis catheter via the right internal jugular vein. The catheter tips lie  in the right atrium. The catheter is ready for immediate use.   Electronically Signed   By: Irish Lack M.D.   On: 12/08/2014 16:30   Dg Fluoro Guide Ndl Plc/bx  12/05/2014   CLINICAL DATA:  Peripheral neuropathy  EXAM: DIAGNOSTIC LUMBAR PUNCTURE UNDER FLUOROSCOPIC GUIDANCE  FLUOROSCOPY TIME:  30 seconds.  PROCEDURE: Informed consent was obtained from the patient prior to the procedure, including potential complications of headache, allergy, and pain. With the patient prone, the lower back was prepped with Betadine. 1% Lidocaine was used for local anesthesia. Lumbar puncture was performed at the left L2-3 level using a 20 gauge needle with return of clear CSF with an opening pressure of 20 cm water. Tenml of CSF were obtained for laboratory studies. The  patient tolerated the procedure well and there were no apparent complications.  COMPLICATIONS: None  IMPRESSION: Successful lumbar puncture for fluid.  Opening depression 20 cm water.  10 cc clear CSF obtained.   Electronically Signed   By: Maryclare Bean M.D.   On: 12/05/2014 08:11    Microbiology: Recent Results (from the past 240 hour(s))  CSF culture     Status: None   Collection Time: 12/04/14 12:32 PM  Result Value Ref Range Status   Specimen Description CSF  Final   Special Requests CSF FLUID NO2 2.5ML  Final   Gram Stain   Final    CYTOSPIN NO WBC SEEN NO ORGANISMS SEEN Performed at [facility] Performed at [facility]    Culture   Final    NO GROWTH 3 DAYS Performed at [facility]    Report Status 12/08/2014 FINAL  Final  Gram stain     Status: None   Collection Time: 12/04/14 12:32 PM  Result Value Ref Range Status   Specimen Description CSF  Final   Special Requests NONE  Final   Gram Stain   Final    WBC PRESENT, PREDOMINANTLY MONONUCLEAR NO ORGANISMS SEEN CYTOSPIN SLIDE    Report Status 12/04/2014 FINAL  Final  Culture, Urine     Status: None   Collection Time: 12/06/14 11:09 PM  Result Value Ref Range Status   Specimen Description URINE, CLEAN CATCH  Final   Special Requests NONE  Final   Colony Count   Final    85,000 COLONIES/ML Performed at [facility]   Final    Multiple bacterial morphotypes present, none predominant. Suggest appropriate recollection if clinically indicated. Performed at [facility]    Report Status 12/08/2014 FINAL  Final     Labs: Basic Metabolic Panel:  Recent Labs Lab 12/07/14 0445 12/09/14 1506 12/11/14 0359 12/12/14 0837  NA 141 140 140 140  K 4.8 3.9 3.6 4.0  CL 107 101 101 100  CO2 23  --   --   --   GLUCOSE 147* 184* 108* 165*  BUN CREATININE 0.74 0.90 0.60 0.70  CALCIUM 9.1  --   --   --    CBC:  Recent Labs Lab 12/07/14 0445 12/09/14 1506  12/09/14 1510 12/11/14 0359 12/12/14 0837 12/12/14 0840  WBC 15.8*  --  14.1*  --   --  16.2*  HGB 10.4* 11.2* 10.3* 10.2* 10.2* 9.4*  HCT 32.4* 33.0* 32.4* 30.0* 30.0* 29.6*  MCV 76.8*  --  75.7*  --   --  75.3*  PLT 403*  --  386  --   --  271  SignedPamella Pert  [facility] 12/13/2014, 1:17 PM

## 2014-12-14 ENCOUNTER — Non-Acute Institutional Stay (HOSPITAL_COMMUNITY)
Admission: AD | Admit: 2014-12-14 | Discharge: 2014-12-14 | Disposition: A | Discharge status: Home / Self Care | Payer: Medicaid Other | Source: Ambulatory Visit | Attending: Neurology | Admitting: Neurology

## 2014-12-14 DIAGNOSIS — G373 Acute transverse myelitis in demyelinating disease of central nervous system: Secondary | ICD-10-CM | POA: Insufficient documentation

## 2014-12-14 LAB — COMPREHENSIVE METABOLIC PANEL
ALT: 62 U/L — AB (ref 0–35)
AST: 37 U/L (ref 0–37)
Albumin: 3.9 g/dL (ref 3.5–5.2)
Alkaline Phosphatase: 42 U/L (ref 39–117)
Anion gap: 7 (ref 5–15)
BILIRUBIN TOTAL: 0.9 mg/dL (ref 0.3–1.2)
BUN: 7 mg/dL (ref 6–23)
CALCIUM: 8.9 mg/dL (ref 8.4–10.5)
CHLORIDE: 104 mmol/L (ref 96–112)
CO2: 28 mmol/L (ref 19–32)
Creatinine, Ser: 0.62 mg/dL (ref 0.50–1.10)
GFR calc Af Amer: >90 mL/min (ref >90)
Glucose, Bld: 148 mg/dL — ABNORMAL HIGH (ref 70–99)
Potassium: 3.8 mmol/L (ref 3.5–5.1)
SODIUM: 139 mmol/L (ref 135–145)
TOTAL PROTEIN: 5.6 g/dL — AB (ref 6.0–8.3)

## 2014-12-14 LAB — CBC
HCT: 29.4 % — ABNORMAL LOW (ref 36.0–46.0)
Hemoglobin: 9.2 g/dL — ABNORMAL LOW (ref 12.0–15.0)
MCH: 24.5 pg — ABNORMAL LOW (ref 26.0–34.0)
MCHC: 31.3 g/dL (ref 30.0–36.0)
MCV: 78.2 fL (ref 78.0–100.0)
Platelets: 287 10*3/uL (ref 150–400)
RBC: 3.76 MIL/uL — ABNORMAL LOW (ref 3.87–5.11)
RDW: 16 % — ABNORMAL HIGH (ref 11.5–15.5)
WBC: 13.6 10*3/uL — AB (ref 4.0–10.5)

## 2014-12-14 MED ORDER — HEPARIN SODIUM (PORCINE) 1000 UNIT/ML IJ SOLN
1000.0000 [IU] | Freq: Once | INTRAMUSCULAR | Status: AC
  Administered 2014-12-14: 1000 [IU]

## 2014-12-14 MED ORDER — ACETAMINOPHEN 325 MG PO TABS: 650.0000 mg | ORAL_TABLET | ORAL | Status: DC | PRN

## 2014-12-14 MED ORDER — ACD FORMULA A 0.73-2.45-2.2 GM/100ML VI SOLN
500.0000 mL | Status: DC
  Administered 2014-12-14: 500 mL via INTRAVENOUS

## 2014-12-14 MED ORDER — CALCIUM CARBONATE ANTACID 500 MG PO CHEW
CHEWABLE_TABLET | ORAL | Status: AC
  Administered 2014-12-14: 400 mg via ORAL
  Filled 2014-12-14: qty 4

## 2014-12-14 MED ORDER — CALCIUM CARBONATE ANTACID 500 MG PO CHEW
2.0000 | CHEWABLE_TABLET | ORAL | Status: DC
  Administered 2014-12-14: 400 mg via ORAL

## 2014-12-14 MED ORDER — DIPHENHYDRAMINE HCL 25 MG PO CAPS: 25.0000 mg | ORAL_CAPSULE | Freq: Four times a day (QID) | ORAL | Status: DC | PRN

## 2014-12-14 MED ORDER — SODIUM CHLORIDE 0.9 % IV SOLN
4.0000 g | Freq: Once | INTRAVENOUS | Status: AC
  Administered 2014-12-14: 4 g via INTRAVENOUS
  Filled 2014-12-14 (×2): qty 40

## 2014-12-14 MED ORDER — SODIUM CHLORIDE 0.9 % IV SOLN
INTRAVENOUS | Status: AC
  Administered 2014-12-14 (×2): via INTRAVENOUS_CENTRAL
  Filled 2014-12-14 (×4): qty 200

## 2014-12-14 NOTE — Progress Notes (Signed)
Plasma exchange completed without issue, pt tolerated well. No further exchanges ordered, please contact hemodialysis department if further outpatient exchanges needed. Pt has no complaints. Discharged to home in stable condition via wheelchair

## 2014-12-15 LAB — POCT I-STAT, CHEM 8
BUN: 7 mg/dL (ref 6–23)
CHLORIDE: 100 mmol/L (ref 96–112)
CREATININE: 0.6 mg/dL (ref 0.50–1.10)
Calcium, Ion: 1.19 mmol/L (ref 1.12–1.23)
GLUCOSE: 149 mg/dL — AB (ref 70–99)
HCT: 30 % — ABNORMAL LOW (ref 36.0–46.0)
Hemoglobin: 10.2 g/dL — ABNORMAL LOW (ref 12.0–15.0)
Potassium: 3.8 mmol/L (ref 3.5–5.1)
Sodium: 139 mmol/L (ref 135–145)
TCO2: 24 mmol/L (ref 0–100)

## 2014-12-17 LAB — NEUROMYELITIS OPTICA AUTOAB, IGG

## 2014-12-19 ENCOUNTER — Emergency Department (HOSPITAL_COMMUNITY)
Admission: EM | Admit: 2014-12-19 | Discharge: 2014-12-19 | Disposition: A | Discharge status: Home / Self Care | Payer: Medicaid Other | Attending: Emergency Medicine | Admitting: Emergency Medicine

## 2014-12-19 ENCOUNTER — Encounter (HOSPITAL_COMMUNITY): Payer: Self-pay | Admitting: *Deleted

## 2014-12-19 DIAGNOSIS — Z79899 Other long term (current) drug therapy: Secondary | ICD-10-CM | POA: Diagnosis not present

## 2014-12-19 DIAGNOSIS — Z4901 Encounter for fitting and adjustment of extracorporeal dialysis catheter: Secondary | ICD-10-CM | POA: Diagnosis not present

## 2014-12-19 DIAGNOSIS — N186 End stage renal disease: Secondary | ICD-10-CM | POA: Insufficient documentation

## 2014-12-19 DIAGNOSIS — E669 Obesity, unspecified: Secondary | ICD-10-CM | POA: Diagnosis not present

## 2014-12-19 NOTE — ED Provider Notes (Signed)
CSN: [id number]     Arrival date & time 12/19/14  2111 History   First MD Initiated Contact with Patient 12/19/14 2204     Chief Complaint  Patient presents with  . dialysis cath needs removing      (Consider location/radiation/quality/duration/timing/severity/associated sxs/prior Treatment) HPI Comments: Patient is a 39 year old female who was recently discharged from a 9 day stay in the hospital after being admitted for transverse myelitis. Patient was discharged on 1/30. She was being treated with IV steroids and plasmapheresis. Patient has followed up with her PCP and Neurologist in [place] since being discharged. She has her dialysis catheter in place and is unsure how to care for it or how long it should stay in. She denies any problems with it such as pain, redness, or wound. She called the hospital to ask what she should do about having it removed and was instructed to come to the ED to be examined. Patient saw Dr. Amada Jupiter and Dr. Thad Ranger during her hospital stay. Patient received her 5th and final treatment of plasmapheresis on 2/1 as an outpatient.    Past Medical History  Diagnosis Date  . Obesity    Past Surgical History  Procedure Laterality Date  . Eye surgery    . Dilation and curettage of uterus     No family history on file. History  Substance Use Topics  . Smoking status: Never Smoker   . Smokeless tobacco: Not on file  . Alcohol Use: No   OB History    No data available     Review of Systems  Constitutional: Negative for fever, chills and fatigue.  HENT: Negative for trouble swallowing.   Eyes: Negative for visual disturbance.  Respiratory: Negative for shortness of breath.   Cardiovascular: Negative for chest pain and palpitations.  Gastrointestinal: Negative for nausea, vomiting, abdominal pain and diarrhea.  Genitourinary: Negative for dysuria and difficulty urinating.  Musculoskeletal: Negative for arthralgias and neck pain.  Skin: Negative for  color change.  Neurological: Negative for dizziness and weakness.  Psychiatric/Behavioral: Negative for dysphoric mood.      Allergies  Review of patient's allergies indicates no known allergies.  Home Medications   Prior to Admission medications   Medication Sig Start Date End Date Taking? Authorizing Provider  furosemide (LASIX) 20 MG tablet Take 1 tablet (20 mg total) by mouth daily. Patient not taking: Reported on 12/19/2014 12/12/14   Leatha Gilding, MD  potassium chloride (K-DUR) 10 MEQ tablet Take 1 tablet (10 mEq total) by mouth daily. Patient not taking: Reported on 12/19/2014 12/12/14   Leatha Gilding, MD  Vitamin D, Ergocalciferol, (DRISDOL) 50000 UNITS CAPS capsule Take 1 capsule (50,000 Units total) by mouth every 7 (seven) days. 12/08/14   Dawood Elgergawy, MD   BP 128/72 mmHg  Pulse 94  Temp(Src) 98.4 F (36.9 C)  Resp 22  Ht 5\' 4"  (1.626 m)  Wt 300 lb (136.079 kg)  BMI 51.47 kg/m2  SpO2 100%  LMP 11/26/2014 Physical Exam  Constitutional: She is oriented to person, place, and time. She appears well-developed and well-nourished. No distress.  HENT:  Head: Normocephalic and atraumatic.  Eyes: Conjunctivae and EOM are normal.  Neck: Normal range of motion.  Cardiovascular: Normal rate and regular rhythm.  Exam reveals no gallop and no friction rub.   No murmur heard. Pulmonary/Chest: Effort normal and breath sounds normal. She has no wheezes. She has no rales. She exhibits no tenderness.  Abdominal: Soft. She exhibits no distension.  There is no tenderness. There is no rebound.  Musculoskeletal: Normal range of motion.  Neurological: She is alert and oriented to person, place, and time. Coordination normal.  Speech is goal-oriented. Moves limbs without ataxia.   Skin: Skin is warm and dry.  Catheter in place in right upper chest without surrounding erythema, wound, or drainage noted.   Psychiatric: She has a normal mood and affect. Her behavior is normal.   Nursing note and vitals reviewed.   ED Course  Procedures (including critical care time) Labs Review Labs Reviewed - No data to display  Imaging Review No results found.   EKG Interpretation None      MDM   Final diagnoses:  Encounter for dialysis catheter care    11:01 PM Patient's dialysis catheter in place in right upper chest without signs of infection. Based on patient's notes, her last plasmapheresis treatment was on 12/14/2014. I spoke with Dr. Roseanne Reno who agrees the patient can have her catheter removed. Patient will be referred to [facility] Interventional Radiology for catheter removal. Patient instructed to return with worsening or concerning symptoms.     [address] [place], PA-C 12/20/14 0018  Arby Barrette, MD 12/20/14 [phone]

## 2014-12-19 NOTE — Discharge Instructions (Signed)
Call the number provided on Monday morning to have your catheter removed. Refer to attached documents for more information.

## 2014-12-19 NOTE — ED Notes (Signed)
The pt has been in the hospital until klast Saturday.  She has dialysis cath in her rt upper chest.  She was here and plasmaphoresis was performed on the cath.  She was not old what to do about.  Her last treatment was Monday.  No home healtgh care nurse has been out to see her.  shes not sure of anything.  lmop one month

## 2014-12-22 ENCOUNTER — Other Ambulatory Visit (HOSPITAL_COMMUNITY): Payer: Self-pay | Admitting: Neurology

## 2014-12-22 DIAGNOSIS — G373 Acute transverse myelitis in demyelinating disease of central nervous system: Secondary | ICD-10-CM

## 2014-12-23 ENCOUNTER — Ambulatory Visit (HOSPITAL_COMMUNITY)
Admission: RE | Admit: 2014-12-23 | Discharge: 2014-12-23 | Disposition: A | Discharge status: Home / Self Care | Payer: Medicaid Other | Source: Ambulatory Visit | Attending: Neurology | Admitting: Neurology

## 2014-12-23 DIAGNOSIS — G0489 Other myelitis: Secondary | ICD-10-CM | POA: Diagnosis present

## 2014-12-23 DIAGNOSIS — G373 Acute transverse myelitis in demyelinating disease of central nervous system: Secondary | ICD-10-CM

## 2014-12-23 MED ORDER — LIDOCAINE HCL 1 % IJ SOLN
INTRAMUSCULAR | Status: AC
  Filled 2014-12-23: qty 20

## 2014-12-23 MED ORDER — CHLORHEXIDINE GLUCONATE 4 % EX LIQD
CUTANEOUS | Status: AC
  Filled 2014-12-23: qty 15

## 2014-12-23 NOTE — Progress Notes (Signed)
Request for removal of right tunneled IJ catheter that has been placed on 12/08/14 for plasmapheresis that is no longer needed. Successful removal of catheter with no immediate complications, sterile dressing placed.   Pattricia Boss PA-C Interventional Radiology  12/23/14  1:06 PM

## 2014-12-25 ENCOUNTER — Ambulatory Visit: Payer: Self-pay | Admitting: Physician Assistant

## 2015-01-08 ENCOUNTER — Encounter (HOSPITAL_COMMUNITY): Payer: Self-pay | Admitting: Emergency Medicine

## 2015-01-08 DIAGNOSIS — H469 Unspecified optic neuritis: Secondary | ICD-10-CM | POA: Diagnosis not present

## 2015-01-08 DIAGNOSIS — H5713 Ocular pain, bilateral: Secondary | ICD-10-CM | POA: Diagnosis present

## 2015-01-08 DIAGNOSIS — E669 Obesity, unspecified: Secondary | ICD-10-CM | POA: Insufficient documentation

## 2015-01-08 DIAGNOSIS — Z9889 Other specified postprocedural states: Secondary | ICD-10-CM | POA: Diagnosis not present

## 2015-01-08 NOTE — ED Notes (Signed)
The paitent was diagnosed with Transverse Myelitis.  She said she was here three weeks ago and was admitted with this diagnosis.   The patient denies any vision loss but does say she has pain in both eye and thinks it may be related to the TM.  She rates her pain 2/10 in both eyes.

## 2015-01-09 ENCOUNTER — Emergency Department (HOSPITAL_COMMUNITY)
Admission: EM | Admit: 2015-01-09 | Discharge: 2015-01-09 | Disposition: A | Discharge status: Home / Self Care | Payer: Medicaid Other | Attending: Emergency Medicine | Admitting: Emergency Medicine

## 2015-01-09 DIAGNOSIS — H469 Unspecified optic neuritis: Secondary | ICD-10-CM

## 2015-01-09 HISTORY — DX: Acute transverse myelitis in demyelinating disease of central nervous system: G37.3

## 2015-01-09 MED ORDER — PREDNISONE 20 MG PO TABS: 60.0000 mg | ORAL_TABLET | Freq: Every day | ORAL | Status: DC

## 2015-01-09 MED ORDER — PREDNISONE 20 MG PO TABS
60.0000 mg | ORAL_TABLET | Freq: Once | ORAL | Status: AC
  Administered 2015-01-09: 60 mg via ORAL
  Filled 2015-01-09: qty 3

## 2015-01-09 NOTE — Discharge Instructions (Signed)
What is optic neuritis? -- Optic neuritis is a condition that causes vision problems, eye pain, and other symptoms. It happens when the nerve that goes from the eye to the brain gets inflamed (figure 1). This nerve is called the optic nerve.  Optic neuritis is common in people who have a disease called multiple sclerosis (called MS, for short). It can also happen in people who have no other health conditions.  Optic neuritis sometimes happens to people who have conditions other than MS. But this is not common. The conditions include:  ?Infections - Children are more likely to get optic neuritis after an infection, such as chickenpox or the flu. ?Diseases that affect the bodys infection-fighting system (called the immune system) - These include lupus and sarcoid. What are the symptoms of optic neuritis? -- Optic neuritis usually happens in one eye at a time. But it sometimes happens in both eyes. The main symptoms include:  ?Vision loss - A person with optic neuritis usually has trouble seeing clearly from one or both eyes. Vision usually gets worse for several hours or days. Most people do not lose all their vision. Vision usually starts to get better in a few weeks. ?Eye pain - The eyes might hurt more when they move. Pain usually starts when vision starts to get worse. As vision gets better, the pain goes away. Less common symptoms include:  ?Seeing flashes or flickers of light ?Having trouble seeing colors - You might think that bright colors look dark Should I see a doctor or nurse? -- See your doctor or nurse right away if you start to lose your vision.  Will I need tests? -- Yes. Doctors will do tests to see if optic neuritis is the cause of your symptoms. They will also check for signs of MS. This is because optic neuritis can be the first symptom of MS.  You will have the following tests:  ?Dilated eye exam - During this exam, the eye doctor gives you eye drops to make your  pupils open up. (When your pupils are open, it is easier for the doctor to see the different parts of the inside of your eye.) After the drops open your pupils, the doctor looks at the back of your eye, where the optic nerve is. He or she looks for signs of optic neuritis. ?MRI of the brain - This is an imaging test that creates pictures of the brain. It can show changes to the optic nerve and brain. It can help doctors find MS or another condition that is causing the optic neuritis. ?Blood tests - Doctors do these to look for signs of MS or conditions that sometimes happen with MS. Some people also have a test called a lumbar puncture. Another name for this test is a spinal tap. In this procedure, a doctor puts a thin needle into your lower back and takes out a small amount of spinal fluid. Spinal fluid is the fluid around your brain and spinal cord. Lab tests of the fluid can tell the doctor more about what is causing the optic neuritis. Most people do not need this test, but some do.  How is optic neuritis treated? -- Optic neuritis usually gets better without treatment. This can take a few weeks or many months.  Doctors can treat severe optic neuritis with steroid medicines given through a thin tube called an IV. The tube goes into a vein. The medicines help the optic neuritis go away faster.  If you  have optic neuritis and your MRI shows you are at high risk of getting MS, your doctor might give you medicines. These medicines can slow down the MS or keep it from happening. They include interferon beta-1a (sample brand names: Avonex, Rebif), interferon beta 1b (sample brand names: Betaseron, Shaune Spittle), or glatiramer acetate (brand name: Copaxone).  Will my vision get better? -- Most people start to see better in a few weeks. After a year, most people have vision that is almost as good as before the optic neuritis. But some people have lifelong vision loss or even blindness.  About 1 of every 3  people who get optic neuritis gets it again later.  Will I get MS? -- About half the people who get optic neuritis get MS in the next 15 years. If the results of your MRI are normal, you have less chance of getting MS. If the MRI shows changes to the optic nerve and brain, you have a higher chance of MS. People with more changes have a higher risk than people with just a few.

## 2015-01-09 NOTE — ED Provider Notes (Signed)
CSN: [id number]     Arrival date & time 01/08/15  2233 History  This chart was scribed for Jessica Crumble, MD by Modena Jansky, ED Scribe. This patient was seen in room A11C/A11C and the patient's care was started at 2:12 AM.   Chief Complaint  Patient presents with  . 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  She said she was here three weeks ago and was admitted with this diagnosis.    The history is provided by the patient. No language interpreter was used.   HPI Comments: Jessica Cisneros is a 39 y.o. female who presents to the Emergency Department complaining of moderate intermittent bilateral eye pain that started about 2-3 days ago. She reports that that she was recently dx with transverse myelitis and the pain may be related to her dx. She states that her pain is usually in one eye at a time and it intermittently alternates. She reports some numbness in her fingers which has been consistent with her transverse myelitis diagnosis. She denies any facial numbness, blurry vision, or fever or new neurological symptoms.  Past Medical History  Diagnosis Date  . Obesity   . Transverse myelitis    Past Surgical History  Procedure Laterality Date  . Eye surgery    . Dilation and curettage of uterus     History reviewed. No pertinent family history. History  Substance Use Topics  . Smoking status: Never Smoker   . Smokeless tobacco: Not on file  . Alcohol Use: No   OB History    No data available     Review of Systems 10 Systems reviewed and all are negative for acute change except as noted in the HPI.  Allergies  Review of patient's allergies indicates no known allergies.  Home Medications   Prior to Admission medications   Medication Sig Start Date End Date Taking? Authorizing Provider  furosemide (LASIX) 20 MG tablet Take 1 tablet (20 mg total) by mouth daily. Patient not taking: Reported on 12/19/2014 12/12/14   Leatha Gilding, MD  potassium chloride  (K-DUR) 10 MEQ tablet Take 1 tablet (10 mEq total) by mouth daily. Patient not taking: Reported on 12/19/2014 12/12/14   Leatha Gilding, MD  Vitamin D, Ergocalciferol, (DRISDOL) 50000 UNITS CAPS capsule Take 1 capsule (50,000 Units total) by mouth every 7 (seven) days. 12/08/14   Dawood Elgergawy, MD   BP 140/77 mmHg  Pulse 96  Temp(Src) 98.3 F (36.8 C) (Oral)  Resp 16  SpO2 97%  LMP 12/18/2014 Physical Exam  Constitutional: She is oriented to person, place, and time. She appears well-developed and well-nourished. No distress.  HENT:  Head: Normocephalic and atraumatic.  Nose: Nose normal.  Mouth/Throat: Oropharynx is clear and moist. No oropharyngeal exudate.  Eyes: Conjunctivae and EOM are normal. Pupils are equal, round, and reactive to light. No scleral icterus.   No papilledema. 20/20 vision.   Neck: Normal range of motion. Neck supple. No JVD present. No tracheal deviation present. No thyromegaly present.  Cardiovascular: Normal rate, regular rhythm and normal heart sounds.  Exam reveals no gallop and no friction rub.   No murmur heard. Pulmonary/Chest: Effort normal and breath sounds normal. No respiratory distress. She has no wheezes. She exhibits no tenderness.  Abdominal: Soft. Bowel sounds are normal. She exhibits no distension and no mass. There is no tenderness. There is no rebound and no guarding.  Musculoskeletal: Normal range of motion. She exhibits no edema or tenderness.  Lymphadenopathy:    She has no cervical adenopathy.  Neurological: She is alert and oriented to person, place, and time. No cranial nerve deficit. She exhibits normal muscle tone.  Normal strength. Normal cerebellar test.  Decreased sensation to bilateral upper extremities.   Skin: Skin is warm and dry. No rash noted. No erythema. No pallor.  Nursing note and vitals reviewed.   ED Course  Procedures (including critical care time) DIAGNOSTIC STUDIES: Oxygen Saturation is 97% on RA, normal by my  interpretation.    COORDINATION OF CARE: 2:16 AM- Pt advised of plan for treatment and pt agrees.  Labs Review Labs Reviewed - No data to display  Imaging Review No results found.   EKG Interpretation None      MDM   Final diagnoses:  None   Patient presents emergency department for eye irritation and at times blurry vision.  Patient was recently diagnosed with transverse myelitis however she is not currently on steroids. This could be an optic neuritis. We'll treat with prednisone was given the emergency department. Patient will be given a prescription for 4 more days and neurology follow-up was strongly advised. Patient is amenable to plan. Her vital signs remain within her normal limits and she is safe for discharge.  I personally performed the services described in this documentation, which was scribed in my presence. The recorded information has been reviewed and is accurate.     Jessica Crumble, MD 01/09/15 [phone]

## 2015-08-25 ENCOUNTER — Emergency Department (HOSPITAL_COMMUNITY)
Admission: EM | Admit: 2015-08-25 | Discharge: 2015-08-25 | Disposition: A | Discharge status: Home / Self Care | Payer: Medicaid Other | Attending: Emergency Medicine | Admitting: Emergency Medicine

## 2015-08-25 ENCOUNTER — Encounter (HOSPITAL_COMMUNITY): Payer: Self-pay | Admitting: Family Medicine

## 2015-08-25 DIAGNOSIS — M549 Dorsalgia, unspecified: Secondary | ICD-10-CM | POA: Diagnosis not present

## 2015-08-25 DIAGNOSIS — Z7952 Long term (current) use of systemic steroids: Secondary | ICD-10-CM | POA: Insufficient documentation

## 2015-08-25 DIAGNOSIS — R200 Anesthesia of skin: Secondary | ICD-10-CM | POA: Diagnosis present

## 2015-08-25 DIAGNOSIS — Z8669 Personal history of other diseases of the nervous system and sense organs: Secondary | ICD-10-CM | POA: Insufficient documentation

## 2015-08-25 DIAGNOSIS — R202 Paresthesia of skin: Secondary | ICD-10-CM

## 2015-08-25 DIAGNOSIS — E669 Obesity, unspecified: Secondary | ICD-10-CM | POA: Insufficient documentation

## 2015-08-25 NOTE — Discharge Instructions (Signed)
Paresthesia Paresthesia is an abnormal burning or prickling sensation. This sensation is generally felt in the hands, arms, legs, or feet. However, it may occur in any part of the body. Usually, it is not painful. The feeling may be described as:  Tingling or numbness.  Pins and needles.  Skin crawling.  Buzzing.  Limbs falling asleep.  Itching. Most people experience temporary (transient) paresthesia at some time in their lives. Paresthesia may occur when you breathe too quickly (hyperventilation). It can also occur without any apparent cause. Commonly, paresthesia occurs when pressure is placed on a nerve. The sensation quickly goes away after the pressure is removed. For some people, however, paresthesia is a long-lasting (chronic) condition that is caused by an underlying disorder. If you continue to have paresthesia, you may need further medical evaluation. HOME CARE INSTRUCTIONS Watch your condition for any changes. Taking the following actions may help to lessen any discomfort that you are feeling:  Avoid drinking alcohol.  Try acupuncture or massage to help relieve your symptoms.  Keep all follow-up visits as directed by your health care provider. This is important. SEEK MEDICAL CARE IF:  You continue to have episodes of paresthesia.  Your burning or prickling feeling gets worse when you walk.  You have pain, cramps, or dizziness.  You develop a rash. SEEK IMMEDIATE MEDICAL CARE IF:  You feel weak.  You have trouble walking or moving.  You have problems with speech, understanding, or vision.  You feel confused.  You cannot control your bladder or bowel movements.  You have numbness after an injury.  You faint.   This information is not intended to replace advice given to you by your health care provider. Make sure you discuss any questions you have with your health care provider.   Document Released: 10/20/2002 Document Revised: 03/16/2015 Document Reviewed:  10/26/2014 Elsevier Interactive Patient Education 2016 [employer]  

## 2015-08-25 NOTE — Consult Note (Signed)
NEURO HOSPITALIST CONSULT NOTE   Referring physician: Rubin Payor   Reason for Consult: LE tingling  HPI:                                                                                                                                          Jessica Cisneros is an 39 y.o. female who was diagnosed with cervical transverse myelitis back in January of 2016. At that time she received PLX and had her final dose as a out patient. HE main symptoms at that time was tingling in her LE and perineal region. She states her symptoms had fully resolved over time.  She has been followed by [facility] since her hospitalization. Over the last 4 weeks she has noted tingling sensation in bilateral legs and perineal region again. Due to fear she was having a flair she has come to the ED.  Currently she feels no weakness and has no bowel or bladder issues.   Past Medical History  Diagnosis Date  . Obesity   . Transverse myelitis [facility])     Past Surgical History  Procedure Laterality Date  . Eye surgery    . Dilation and curettage of uterus      Family History  Problem Relation Age of Onset  . Hyperlipidemia Mother   . Hypertension Mother     Social History:  reports that she has never smoked. She does not have any smokeless tobacco history on file. She reports that she does not drink alcohol or use illicit drugs.  No Known Allergies  MEDICATIONS:                                                                                                                     No current facility-administered medications for this encounter.   Current Outpatient Prescriptions  Medication Sig Dispense Refill  . furosemide (LASIX) 20 MG tablet Take 1 tablet (20 mg total) by mouth daily. (Patient not taking: Reported on 12/19/2014) 4 tablet 0  . potassium chloride (K-DUR) 10 MEQ tablet Take 1 tablet (10 mEq total) by mouth daily. (Patient not taking: Reported on 12/19/2014) 4 tablet 0  . predniSONE  (DELTASONE) 20 MG tablet Take 3 tablets (60 mg total) by mouth daily. 12 tablet 0  . Vitamin D,  Ergocalciferol, (DRISDOL) 50000 UNITS CAPS capsule Take 1 capsule (50,000 Units total) by mouth every 7 (seven) days. (Patient taking differently: Take 50,000 Units by mouth every 7 (seven) days. Saturdays) 30 capsule 0      ROS:                                                                                                                                       History obtained from the patient  General ROS: negative for - chills, fatigue, fever, night sweats, weight gain or weight loss Psychological ROS: negative for - behavioral disorder, hallucinations, memory difficulties, mood swings or suicidal ideation Ophthalmic ROS: negative for - blurry vision, double vision, eye pain or loss of vision ENT ROS: negative for - epistaxis, nasal discharge, oral lesions, sore throat, tinnitus or vertigo Allergy and Immunology ROS: negative for - hives or itchy/watery eyes Hematological and Lymphatic ROS: negative for - bleeding problems, bruising or swollen lymph nodes Endocrine ROS: negative for - galactorrhea, hair pattern changes, polydipsia/polyuria or temperature intolerance Respiratory ROS: negative for - cough, hemoptysis, shortness of breath or wheezing Cardiovascular ROS: negative for - chest pain, dyspnea on exertion, edema or irregular heartbeat Gastrointestinal ROS: negative for - abdominal pain, diarrhea, hematemesis, nausea/vomiting or stool incontinence Genito-Urinary ROS: negative for - dysuria, hematuria, incontinence or urinary frequency/urgency Musculoskeletal ROS: negative for - joint swelling or muscular weakness Neurological ROS: as noted in HPI Dermatological ROS: negative for rash and skin lesion changes   Blood pressure 123/67, pulse 86, temperature 98.9 F (37.2 C), temperature source Oral, resp. rate 18, SpO2 100 %.   Neurologic Examination:                                                                                                       HEENT-  Normocephalic, no lesions, without obvious abnormality.  Normal external eye and conjunctiva.  Normal TM's bilaterally.  Normal auditory canals and external ears. Normal external nose, mucus membranes and septum.  Normal pharynx. Cardiovascular- S1, S2 normal, pulses palpable throughout   Lungs- chest clear, no wheezing, rales, normal symmetric air entry Abdomen- normal findings: bowel sounds normal Extremities- no edema Lymph-no adenopathy palpable Musculoskeletal-no joint tenderness, deformity or swelling Skin-warm and dry, no hyperpigmentation, vitiligo, or suspicious lesions  Neurological Examination Mental Status: Alert, oriented, thought content appropriate.  Speech fluent without evidence of aphasia.  Able to follow 3 step commands without difficulty. Cranial Nerves: II: Discs flat bilaterally; Visual fields grossly normal, pupils equal, round,  reactive to light and accommodation III,IV, VI: ptosis not present, extra-ocular motions intact bilaterally V,VII: smile symmetric, facial light touch sensation normal bilaterally VIII: hearing normal bilaterally IX,X: uvula rises symmetrically XI: bilateral shoulder shrug XII: midline tongue extension Motor: Right : Upper extremity   5/5    Left:     Upper extremity   5/5  Lower extremity   5/5     Lower extremity   5/5 Tone and bulk:normal tone throughout; no atrophy noted Sensory: Pinprick and light touch intact throughout, bilaterally Deep Tendon Reflexes: 1+ and symmetric throughout Plantars: Right: downgoing   Left: downgoing Cerebellar: normal finger-to-nose and normal heel-to-shin test Gait: normal gait and station      Lab Results: Basic Metabolic Panel: No results for input(s): NA, K, CL, CO2, GLUCOSE, BUN, CREATININE, CALCIUM, MG, PHOS in the last 168 hours.  Liver Function Tests: No results for input(s): AST, ALT, ALKPHOS, BILITOT, PROT, ALBUMIN  in the last 168 hours. No results for input(s): LIPASE, AMYLASE in the last 168 hours. No results for input(s): AMMONIA in the last 168 hours.  CBC: No results for input(s): WBC, NEUTROABS, HGB, HCT, MCV, PLT in the last 168 hours.  Cardiac Enzymes: No results for input(s): CKTOTAL, CKMB, CKMBINDEX, TROPONINI in the last 168 hours.  Lipid Panel: No results for input(s): CHOL, TRIG, HDL, CHOLHDL, VLDL, LDLCALC in the last 168 hours.  CBG: No results for input(s): GLUCAP in the last 168 hours.  Microbiology: Results for orders placed or performed during the hospital encounter of 12/03/14  CSF culture     Status: None   Collection Time: 12/04/14 12:32 PM  Result Value Ref Range Status   Specimen Description CSF  Final   Special Requests CSF FLUID NO2 2.5ML  Final   Gram Stain   Final    CYTOSPIN NO WBC SEEN NO ORGANISMS SEEN Performed at [facility] Performed at [facility]    Culture   Final    NO GROWTH 3 DAYS Performed at [facility]    Report Status 12/08/2014 FINAL  Final  Gram stain     Status: None   Collection Time: 12/04/14 12:32 PM  Result Value Ref Range Status   Specimen Description CSF  Final   Special Requests NONE  Final   Gram Stain   Final    WBC PRESENT, PREDOMINANTLY MONONUCLEAR NO ORGANISMS SEEN CYTOSPIN SLIDE    Report Status 12/04/2014 FINAL  Final  Culture, Urine     Status: None   Collection Time: 12/06/14 11:09 PM  Result Value Ref Range Status   Specimen Description URINE, CLEAN CATCH  Final   Special Requests NONE  Final   Colony Count   Final    85,000 COLONIES/ML Performed at [facility]   Final    Multiple bacterial morphotypes present, none predominant. Suggest appropriate recollection if clinically indicated. Performed at [facility]    Report Status 12/08/2014 FINAL  Final    Coagulation Studies: No results for input(s): LABPROT, INR in the last 72 hours.  Imaging: No  results found.     Assessment and plan per attending neurologist  Felicie Morn PA-C [facility] [phone]  08/25/2015, 3:23 PM   Assessment/Plan: 39 YO female with history of cervical transverse myelitis now presenting with 4 week onset of bilateral leg and perineal tingling. She reports that her symptoms are stable compared to when she saw her outpatient neurologist on 10/04. At that time  the plan was to get outpatient EMG/NCS and MRI C spine. As she is currently able to walk without difficulty she would be best suited to complete the workup as an outpatient and follow up with her neurologist at [facility]. Counseled to return to the ED if symptoms worsen.   Elspeth Cho, DO [facility] [phone]  If 7pm- 7am, please page neurology on call as listed in AMION.'

## 2015-08-25 NOTE — ED Provider Notes (Signed)
CSN: [id number]     Arrival date & time 08/25/15  1203 History   First MD Initiated Contact with Patient 08/25/15 1444     Chief Complaint  Patient presents with  . Numbness     (Consider location/radiation/quality/duration/timing/severity/associated sxs/prior Treatment) The history is provided by the patient.   patient has a history of transverse myelitis and polycystic ovarian disease. Had initial transverse myelitis 10 months ago. Over last 3 weeks she has had numbness in her legs. States worse on the left side but also on the right leg. States is similar to when she had transverse myelitis. She's also having back pain. States she has not previous head much back pain. No fevers or chills. She states it started in her groin area and worked its way down.  Past Medical History  Diagnosis Date  . Obesity   . Transverse myelitis [facility])    Past Surgical History  Procedure Laterality Date  . Eye surgery    . Dilation and curettage of uterus     Family History  Problem Relation Age of Onset  . Hyperlipidemia Mother   . Hypertension Mother    Social History  Substance Use Topics  . Smoking status: Never Smoker   . Smokeless tobacco: None  . Alcohol Use: No   OB History    No data available     Review of Systems  Constitutional: Negative for activity change and appetite change.  Eyes: Negative for pain.  Respiratory: Negative for chest tightness and shortness of breath.   Cardiovascular: Negative for chest pain and leg swelling.  Gastrointestinal: Negative for nausea, vomiting, abdominal pain and diarrhea.  Genitourinary: Negative for flank pain.  Musculoskeletal: Positive for back pain. Negative for neck stiffness.  Skin: Negative for rash.  Neurological: Positive for numbness. Negative for weakness and headaches.  Psychiatric/Behavioral: Negative for behavioral problems.      Allergies  Review of patient's allergies indicates no known allergies.  Home Medications    Prior to Admission medications   Medication Sig Start Date End Date Taking? Authorizing Provider  furosemide (LASIX) 20 MG tablet Take 1 tablet (20 mg total) by mouth daily. Patient not taking: Reported on 12/19/2014 12/12/14   Leatha Gilding, MD  potassium chloride (K-DUR) 10 MEQ tablet Take 1 tablet (10 mEq total) by mouth daily. Patient not taking: Reported on 12/19/2014 12/12/14   Leatha Gilding, MD  predniSONE (DELTASONE) 20 MG tablet Take 3 tablets (60 mg total) by mouth daily. 01/09/15   [name], MD  Vitamin D, Ergocalciferol, (DRISDOL) 50000 UNITS CAPS capsule Take 1 capsule (50,000 Units total) by mouth every 7 (seven) days. Patient taking differently: Take 50,000 Units by mouth every 7 (seven) days. Saturdays 12/08/14   Leana Roe Elgergawy, MD   BP 113/58 mmHg  Pulse 71  Temp(Src) 98.9 F (37.2 C) (Oral)  Resp 14  SpO2 97% Physical Exam  Constitutional: She appears well-developed.  HENT:  Head: Atraumatic.  Eyes: Pupils are equal, round, and reactive to light.  Cardiovascular: Normal rate.   Pulmonary/Chest: Effort normal.  Abdominal: Soft.  Musculoskeletal: Normal range of motion.  Neurological: She is alert.  Good grip strength bilaterally. Slight chronic paresthesias in bilateral hands. Good strength and flexion extension at the knee bilaterally. Good flexion extension at the ankles. Decreased sensation grossly to bilateral lower legs, worse on the left.  Skin: Skin is warm.    ED Course  Procedures (including critical care time) Labs Review Labs Reviewed - No data  to display  Imaging Review No results found. I have personally reviewed and evaluated these images and lab results as part of my medical decision-making.   EKG Interpretation None      MDM   Final diagnoses:  Paresthesias    Patient with paresthesias. Has seen her neurologist at [facility] for the same. Seen by neurology here. The outpatient plan 10 days ago was for EMG and nerve conduction  study. Per neurology was seems like a reasonable plan at this time decided acute MRI or admission. Will discharge follow-up as planned.    Benjiman Core, MD 08/25/15 [phone]

## 2015-08-25 NOTE — ED Notes (Signed)
Pt here for numbness that started in here feet and has been progressing over the past 4 weeks. sts ascending to pelvic area. St shx of the same with transverse myelitis.

## 2015-08-25 NOTE — ED Notes (Signed)
Patient states that sometimes when she feels number she also experiences urinary hesitance.

## 2016-02-24 ENCOUNTER — Other Ambulatory Visit: Payer: Self-pay | Admitting: Family Medicine

## 2016-02-24 DIAGNOSIS — E041 Nontoxic single thyroid nodule: Secondary | ICD-10-CM

## 2016-03-02 ENCOUNTER — Ambulatory Visit
Admission: RE | Admit: 2016-03-02 | Discharge: 2016-03-02 | Disposition: A | Discharge status: Home / Self Care | Payer: Medicaid Other | Source: Ambulatory Visit | Attending: Family Medicine | Admitting: Family Medicine

## 2016-03-02 DIAGNOSIS — E041 Nontoxic single thyroid nodule: Secondary | ICD-10-CM | POA: Insufficient documentation

## 2016-10-02 IMAGING — CR DG CHEST 2V
1 series · 2 of 2 positions shown · non-contrast
Comparison: 06/29/2009

CLINICAL DATA: Numbness to the vaginal and rectal areas since
[REDACTED]. Now with numbness to both seen in the chest. Shortness of
breath.

EXAM:
CHEST  2 VIEW

[Series 1: w chest pa · 0.14mm/px · 2 of 2 slices shown]
[im 1/2]
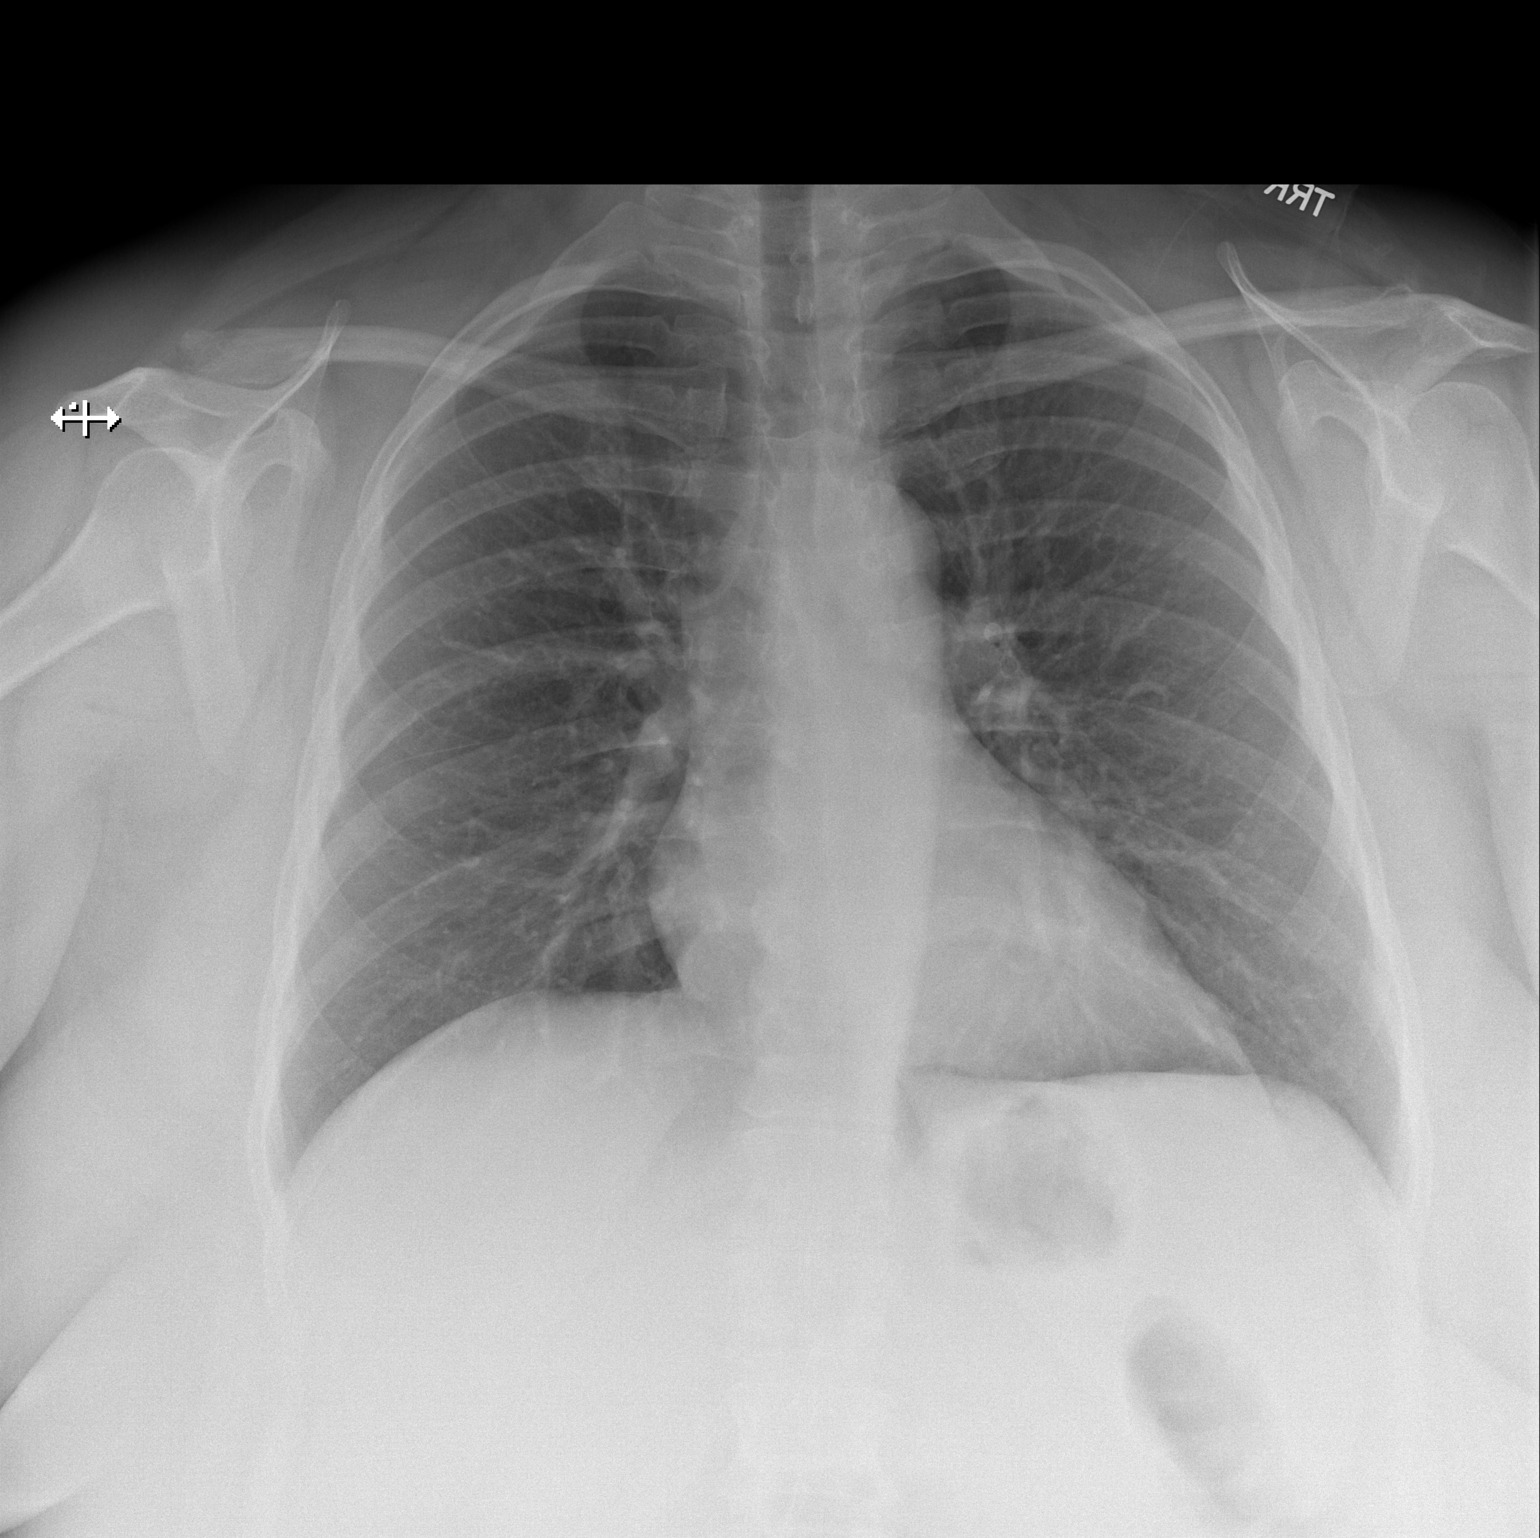
[im 2/2]
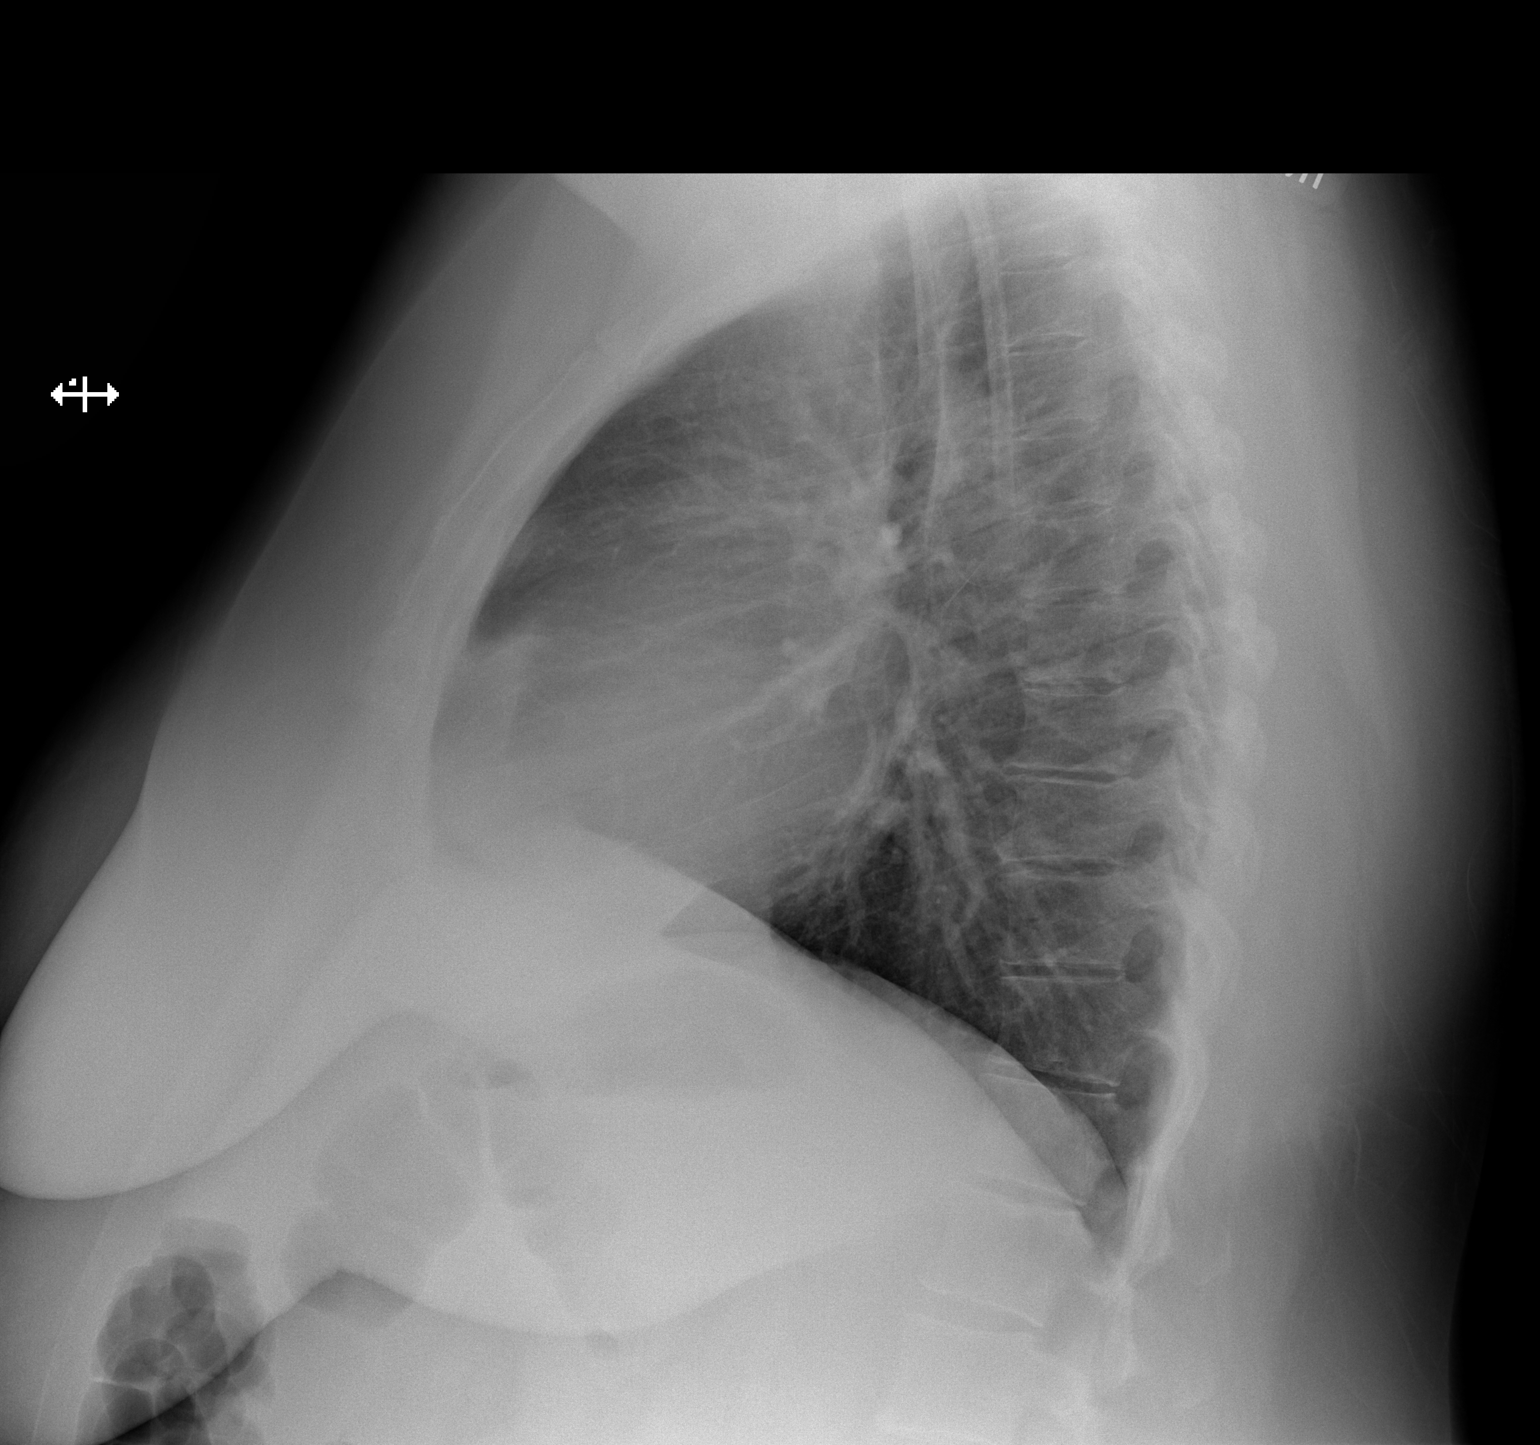

[2 of 2 positions shown; findings below may reference images not displayed]

FINDINGS: The heart size and mediastinal contours are within normal limits.
Both lungs are clear. The visualized skeletal structures are
unremarkable.
IMPRESSION: No active cardiopulmonary disease.

## 2016-10-17 IMAGING — RF DG FLUORO GUIDE NDL PLC/BX
1 series · 1 of 1 positions shown · non-contrast
Comparison: none

CLINICAL DATA: Peripheral neuropathy

[Series 1: cp_standard · 0.18mm/px · 1 of 1 slices shown]
[im 1/1]
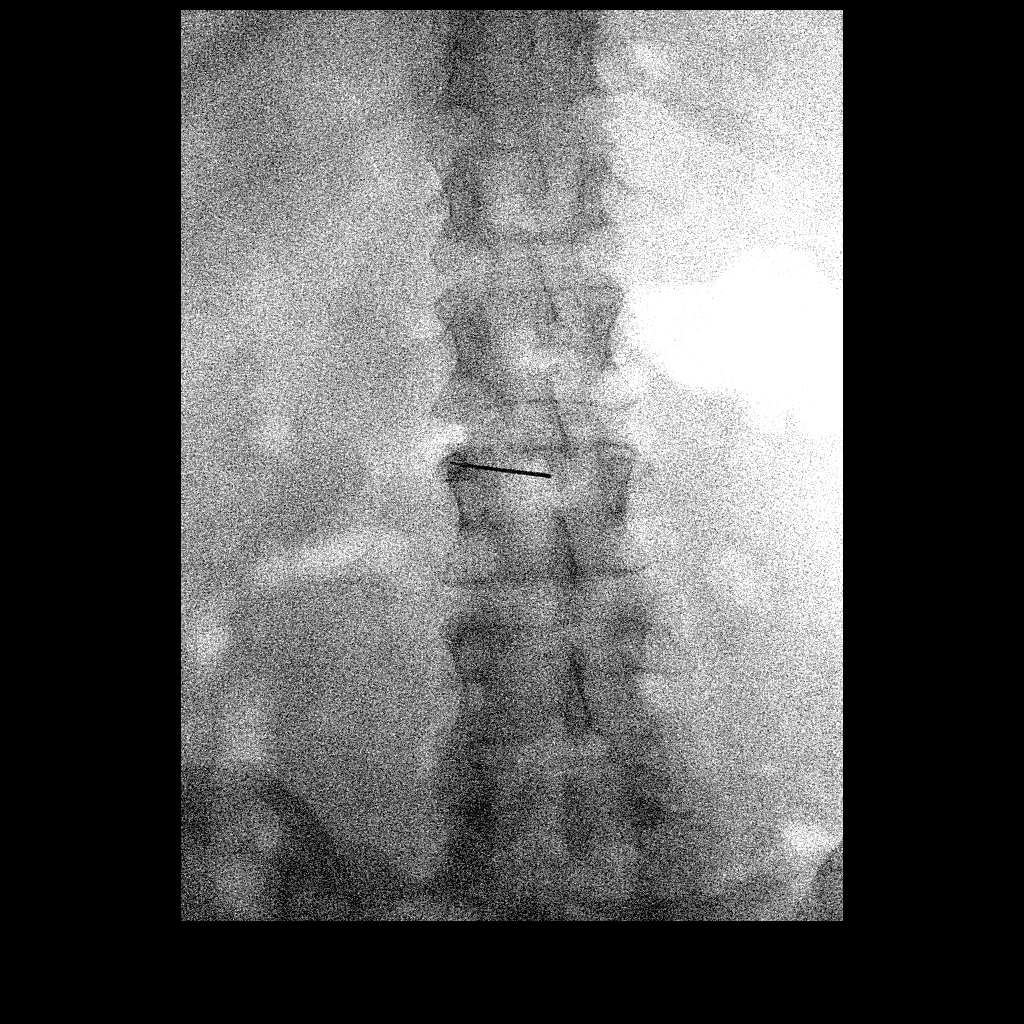

[1 of 1 positions shown; findings below may reference images not displayed]

EXAM:
DIAGNOSTIC LUMBAR PUNCTURE UNDER FLUOROSCOPIC GUIDANCE

FLUOROSCOPY TIME:  30 seconds.

PROCEDURE:
Informed consent was obtained from the patient prior to the
procedure, including potential complications of headache, allergy,
and pain. With the patient prone, the lower back was prepped with
Betadine. 1% Lidocaine was used for local anesthesia. Lumbar
puncture was performed at the left L2-3 level using a 20 gauge
needle with return of clear CSF with an opening pressure of 20 cm
water. Tenml of CSF were obtained for laboratory studies. The
patient tolerated the procedure well and there were no apparent
complications.

COMPLICATIONS:
None
IMPRESSION: Successful lumbar puncture for fluid.

Opening depression 20 cm water.

10 cc clear CSF obtained.

## 2016-10-21 IMAGING — US IR FLUORO GUIDE CV LINE*R*
1 series · 1 of 1 positions shown · non-contrast
Comparison: none

CLINICAL DATA: Transverse myelitis of the cervical spinal cord. The
patient requires placement of a tunneled central catheter for plasma
exchange.

[Series 1: sp fluoro guide cv line*left* · 1 of 1 slices shown]
[im 1/1]
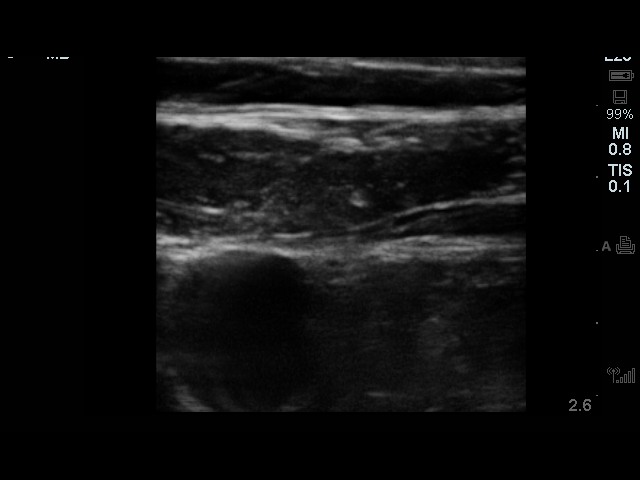

[1 of 1 positions shown; findings below may reference images not displayed]

EXAM:
TUNNELED CENTRAL VENOUS HEMODIALYSIS/PHERESIS CATHETER PLACEMENT
WITH ULTRASOUND AND FLUOROSCOPIC GUIDANCE

ANESTHESIA/SEDATION:
2.0 mg IV Versed; 100 mcg IV Fentanyl.

Total Moderate Sedation Time

20 minutes.

MEDICATIONS:
3 g IV Ancef. As antibiotic prophylaxis, Ancef was ordered
pre-procedure and administered intravenously within one hour of
incision.

FLUOROSCOPY TIME:  48 seconds.

PROCEDURE:
The procedure, risks, benefits, and alternatives were explained to
the patient. Questions regarding the procedure were encouraged and
answered. The patient understands and consents to the procedure.

The right neck and chest were prepped with chlorhexidine in a
sterile fashion, and a sterile drape was applied covering the
operative field. Maximum barrier sterile technique with sterile
gowns and gloves were used for the procedure. Local anesthesia was
provided with 1% lidocaine. A time-out was performed prior to the
procedure.

Ultrasound was used to confirm patency of the right internal jugular
vein. After creating a small venotomy incision, a 21 gauge needle
was advanced into the right internal jugular vein under direct,
real-time ultrasound guidance. Ultrasound image documentation was
performed. After securing guidewire access, an 8 Fr dilator was
placed. A J-wire was kinked to measure appropriate catheter length.

Kubek Preisner Hemosplit tunneled hemodialysis catheter measuring 23 cm from
tip to cuff was chosen for placement. This was tunneled in a
retrograde fashion from the chest wall to the venotomy incision.

At the venotomy, serial dilatation was performed and a 16 Fr
peel-away sheath was placed over a guidewire. The catheter was then
placed through the sheath and the sheath removed. Final catheter
positioning was confirmed and documented with a fluoroscopic spot
image. The catheter was aspirated, flushed with saline, and injected
with appropriate volume heparin dwells.

The venotomy incision was closed with subcutaneous 3-0 Monocryl and
subcuticular 4-0 Vicryl. Dermabond was applied to the incision. The
catheter exit site was secured with 0-Prolene retention sutures.

COMPLICATIONS:
None.  No pneumothorax.
FINDINGS: After catheter placement, the tips lie in the right atrium. The
catheter aspirates normally and is ready for immediate use.
IMPRESSION: Placement of tunneled hemodialysis/pheresis catheter via the right
internal jugular vein. The catheter tips lie in the right atrium.
The catheter is ready for immediate use.

## 2017-02-16 ENCOUNTER — Encounter: Payer: Self-pay | Admitting: Emergency Medicine

## 2017-02-16 ENCOUNTER — Observation Stay: Payer: Medicaid Other | Admitting: Anesthesiology

## 2017-02-16 ENCOUNTER — Emergency Department: Payer: Medicaid Other

## 2017-02-16 ENCOUNTER — Observation Stay
Admission: EM | Admit: 2017-02-16 | Discharge: 2017-02-17 | Disposition: A | Discharge status: Home / Self Care | Payer: Medicaid Other | Attending: Obstetrics & Gynecology | Admitting: Obstetrics & Gynecology

## 2017-02-16 ENCOUNTER — Encounter
Admission: EM | Disposition: A | Discharge status: Home / Self Care | Payer: Self-pay | Source: Home / Self Care | Attending: Emergency Medicine

## 2017-02-16 DIAGNOSIS — N920 Excessive and frequent menstruation with regular cycle: Principal | ICD-10-CM | POA: Insufficient documentation

## 2017-02-16 DIAGNOSIS — Z6841 Body Mass Index (BMI) 40.0 and over, adult: Secondary | ICD-10-CM | POA: Diagnosis not present

## 2017-02-16 DIAGNOSIS — R938 Abnormal findings on diagnostic imaging of other specified body structures: Secondary | ICD-10-CM | POA: Diagnosis not present

## 2017-02-16 DIAGNOSIS — D62 Acute posthemorrhagic anemia: Secondary | ICD-10-CM | POA: Insufficient documentation

## 2017-02-16 DIAGNOSIS — N939 Abnormal uterine and vaginal bleeding, unspecified: Secondary | ICD-10-CM | POA: Diagnosis present

## 2017-02-16 HISTORY — PX: DILATION AND CURETTAGE OF UTERUS: SHX78

## 2017-02-16 LAB — CBC
HCT: 26.9 % — ABNORMAL LOW (ref 35.0–47.0)
HEMATOCRIT: 16.2 % — AB (ref 35.0–47.0)
HEMOGLOBIN: 8.9 g/dL — AB (ref 12.0–16.0)
Hemoglobin: 5 g/dL — ABNORMAL LOW (ref 12.0–16.0)
MCH: 22 pg — ABNORMAL LOW (ref 26.0–34.0)
MCH: 25.6 pg — AB (ref 26.0–34.0)
MCHC: 30.6 g/dL — ABNORMAL LOW (ref 32.0–36.0)
MCHC: 33 g/dL (ref 32.0–36.0)
MCV: 71.9 fL — ABNORMAL LOW (ref 80.0–100.0)
MCV: 77.4 fL — ABNORMAL LOW (ref 80.0–100.0)
PLATELETS: 526 10*3/uL — AB (ref 150–440)
PLATELETS: 417 10*3/uL (ref 150–440)
RBC: 2.26 MIL/uL — ABNORMAL LOW (ref 3.80–5.20)
RBC: 3.47 MIL/uL — ABNORMAL LOW (ref 3.80–5.20)
RDW: 16.7 % — AB (ref 11.5–14.5)
RDW: 18.2 % — AB (ref 11.5–14.5)
WBC: 14.6 10*3/uL — AB (ref 3.6–11.0)
WBC: 13 10*3/uL — ABNORMAL HIGH (ref 3.6–11.0)

## 2017-02-16 LAB — POC URINE PREG, ED: Preg Test, Ur: NEGATIVE

## 2017-02-16 LAB — PREPARE RBC (CROSSMATCH)

## 2017-02-16 LAB — ABO/RH: ABO/RH(D): B POS

## 2017-02-16 LAB — TSH: TSH: 5.617 u[IU]/mL — AB (ref 0.350–4.500)

## 2017-02-16 LAB — T4, FREE: FREE T4: 1 ng/dL (ref 0.61–1.12)

## 2017-02-16 SURGERY — DILATION AND CURETTAGE
Anesthesia: General

## 2017-02-16 MED ORDER — MIDAZOLAM HCL 2 MG/2ML IJ SOLN
INTRAMUSCULAR | Status: AC
  Filled 2017-02-16: qty 2

## 2017-02-16 MED ORDER — FENTANYL CITRATE (PF) 100 MCG/2ML IJ SOLN
25.0000 ug | INTRAMUSCULAR | Status: DC | PRN
  Administered 2017-02-16 (×3): 25 ug via INTRAVENOUS

## 2017-02-16 MED ORDER — SODIUM CHLORIDE 0.9 % IV SOLN
Freq: Once | INTRAVENOUS | Status: AC
  Administered 2017-02-16: 06:00:00 via INTRAVENOUS

## 2017-02-16 MED ORDER — MIDAZOLAM HCL 2 MG/2ML IJ SOLN
INTRAMUSCULAR | Status: DC | PRN
  Administered 2017-02-16: 2 mg via INTRAVENOUS

## 2017-02-16 MED ORDER — FUROSEMIDE 20 MG PO TABS
10.0000 mg | ORAL_TABLET | Freq: Once | ORAL | Status: AC
  Administered 2017-02-16: 10 mg via ORAL
  Filled 2017-02-16 (×2): qty 0.5

## 2017-02-16 MED ORDER — ACETAMINOPHEN 500 MG PO TABS
1000.0000 mg | ORAL_TABLET | Freq: Once | ORAL | Status: AC
  Administered 2017-02-16: 1000 mg via ORAL
  Filled 2017-02-16: qty 2

## 2017-02-16 MED ORDER — LIDOCAINE HCL (PF) 2 % IJ SOLN
INTRAMUSCULAR | Status: AC
  Filled 2017-02-16: qty 2

## 2017-02-16 MED ORDER — ONDANSETRON HCL 4 MG/2ML IJ SOLN: 4.0000 mg | Freq: Once | INTRAMUSCULAR | Status: DC | PRN

## 2017-02-16 MED ORDER — FENTANYL CITRATE (PF) 100 MCG/2ML IJ SOLN
INTRAMUSCULAR | Status: AC
  Administered 2017-02-16: 25 ug via INTRAVENOUS
  Filled 2017-02-16: qty 2

## 2017-02-16 MED ORDER — DIPHENHYDRAMINE HCL 25 MG PO CAPS
25.0000 mg | ORAL_CAPSULE | Freq: Once | ORAL | Status: AC
  Administered 2017-02-16: 25 mg via ORAL
  Filled 2017-02-16: qty 1

## 2017-02-16 MED ORDER — ONDANSETRON HCL 4 MG/2ML IJ SOLN
INTRAMUSCULAR | Status: DC | PRN
  Administered 2017-02-16: 4 mg via INTRAVENOUS

## 2017-02-16 MED ORDER — MEGESTROL ACETATE 40 MG PO TABS
40.0000 mg | ORAL_TABLET | Freq: Two times a day (BID) | ORAL | Status: DC
  Administered 2017-02-16 – 2017-02-17 (×3): 40 mg via ORAL
  Filled 2017-02-16 (×4): qty 1

## 2017-02-16 MED ORDER — DEXAMETHASONE SODIUM PHOSPHATE 10 MG/ML IJ SOLN
INTRAMUSCULAR | Status: AC
  Filled 2017-02-16: qty 1

## 2017-02-16 MED ORDER — FENTANYL CITRATE (PF) 100 MCG/2ML IJ SOLN
INTRAMUSCULAR | Status: AC
  Filled 2017-02-16: qty 2

## 2017-02-16 MED ORDER — SODIUM CHLORIDE 0.9 % IV SOLN
Freq: Once | INTRAVENOUS | Status: AC
  Administered 2017-02-17: 01:00:00 via INTRAVENOUS

## 2017-02-16 MED ORDER — FENTANYL CITRATE (PF) 100 MCG/2ML IJ SOLN
INTRAMUSCULAR | Status: DC | PRN
  Administered 2017-02-16: 50 ug via INTRAVENOUS
  Administered 2017-02-16: 100 ug via INTRAVENOUS

## 2017-02-16 MED ORDER — SODIUM CHLORIDE 0.9 % IV SOLN
INTRAVENOUS | Status: DC
Start: 2017-02-16 — End: 2017-02-17
  Administered 2017-02-16: 08:00:00 via INTRAVENOUS

## 2017-02-16 MED ORDER — PROPOFOL 10 MG/ML IV BOLUS
INTRAVENOUS | Status: DC | PRN
  Administered 2017-02-16: 180 mg via INTRAVENOUS

## 2017-02-16 MED ORDER — PROPOFOL 10 MG/ML IV BOLUS
INTRAVENOUS | Status: AC
  Filled 2017-02-16: qty 20

## 2017-02-16 MED ORDER — LACTATED RINGERS IV SOLN: INTRAVENOUS | Status: DC | PRN

## 2017-02-16 MED ORDER — SODIUM CHLORIDE FLUSH 0.9 % IV SOLN
INTRAVENOUS | Status: AC
  Filled 2017-02-16: qty 3

## 2017-02-16 MED ORDER — LIDOCAINE HCL (CARDIAC) 20 MG/ML IV SOLN
INTRAVENOUS | Status: DC | PRN
  Administered 2017-02-16: 40 mg via INTRAVENOUS

## 2017-02-16 MED ORDER — GLYCOPYRROLATE 0.2 MG/ML IJ SOLN
INTRAMUSCULAR | Status: DC | PRN
  Administered 2017-02-16: .2 mg via INTRAVENOUS

## 2017-02-16 MED ORDER — ACETAMINOPHEN 325 MG PO TABS: 650.0000 mg | ORAL_TABLET | ORAL | Status: DC | PRN

## 2017-02-16 MED ORDER — IBUPROFEN 600 MG PO TABS
600.0000 mg | ORAL_TABLET | Freq: Four times a day (QID) | ORAL | Status: DC
  Administered 2017-02-16 – 2017-02-17 (×4): 600 mg via ORAL
  Filled 2017-02-16 (×4): qty 1

## 2017-02-16 MED ORDER — DEXAMETHASONE SODIUM PHOSPHATE 10 MG/ML IJ SOLN
INTRAMUSCULAR | Status: DC | PRN
  Administered 2017-02-16: 8 mg via INTRAVENOUS

## 2017-02-16 MED ORDER — ONDANSETRON HCL 4 MG/2ML IJ SOLN
INTRAMUSCULAR | Status: AC
  Filled 2017-02-16: qty 2

## 2017-02-16 SURGICAL SUPPLY — 20 items
CATH ROBINSON RED A/P 16FR (CATHETERS) ×3 IMPLANT
GLOVE BIO SURGEON STRL SZ 6.5 (GLOVE) ×4 IMPLANT
GLOVE BIO SURGEONS STRL SZ 6.5 (GLOVE) ×2
GLOVE BIOGEL PI IND STRL 7.0 (GLOVE) ×2 IMPLANT
GLOVE BIOGEL PI INDICATOR 7.0 (GLOVE) ×4
GLOVE PI ORTHOPRO 6.5 (GLOVE) ×6
GLOVE PI ORTHOPRO STRL 6.5 (GLOVE) ×3 IMPLANT
GLOVE SURG SYN 6.5 ES PF (GLOVE) ×3 IMPLANT
GOWN STRL REUS W/ TWL LRG LVL3 (GOWN DISPOSABLE) ×3 IMPLANT
GOWN STRL REUS W/TWL LRG LVL3 (GOWN DISPOSABLE) ×6
KIT 2% CHLOROHEXIDINE (PERSONAL CARE ITEMS) IMPLANT
KIT RM TURNOVER CYSTO AR (KITS) ×3 IMPLANT
NEEDLE SPNL 20GX3.5 QUINCKE YW (NEEDLE) ×3 IMPLANT
PACK DNC HYST (MISCELLANEOUS) ×3 IMPLANT
PAD OB MATERNITY 4.3X12.25 (PERSONAL CARE ITEMS) ×3 IMPLANT
PAD PREP 24X41 OB/GYN DISP (PERSONAL CARE ITEMS) ×3 IMPLANT
SUT VIC AB 3-0 SH 27 (SUTURE) ×2
SUT VIC AB 3-0 SH 27X BRD (SUTURE) ×1 IMPLANT
SYRINGE 10CC LL (SYRINGE) ×3 IMPLANT
TOWEL OR 17X26 4PK STRL BLUE (TOWEL DISPOSABLE) IMPLANT

## 2017-02-16 NOTE — H&P (Signed)
Consult History and Physical   SERVICE: Gynecology   Patient Name: Jessica Cisneros Patient MRN:   [id number]  CC: vaginal bleeding  HPI: Jessica Cisneros is a 41 y.o. [id number] who has had 30 days of vaginal bleeding.  Prior to her continuous bleeding, she was having some intramenstrual bleeding as well, for about the last 2-3 months.  She thought she was nearing menopause.  She denies hypothyroid but does have nodules (due to scan this year for surveillance). Denies trauma, medicines, exogenous hormones, foreign body, or irregular cycles in the prior years.  She says she was diagnosed years ago with PCOS possibly (she does not recall how she was given this dx, and has not seen a gynecologist specifically since the birth of her children). She denies anything acutely changing that led her to present to the ED last night; she thought it would improve but it didn't and her concern increased.  She felt weak, dizzy, SOB, and knew she was more anemic than she normally is.  PMH: transverse myelitis PSH: D&C, eye surgery OBH: SVD x 2, MAB s/p D&C GYNH: maybe PCO, claims regular periods until last few months then 2x mo, then 28mo continuous getting heavier.  Has not seen GYN in many years, gets PAP from her P[facility].  Says she has never had abnormal pap. ALL: none known Meds: none other than OTC PRN Social: married, no ETOH drugs or tobacco Family: no gyn cancers  Review of Systems: positives in bold GEN:   fevers, chills, weight changes, appetite changes, fatigue, night sweats HEENT:  HA, vision changes, hearing loss, congestion, rhinorrhea, sinus pressure, dysphagia CV:   CP, palpitations PULM:  SOB, cough GI:  abd pain, N/V/D/C GU:  dysuria, urgency, frequency MSK:  arthralgias, myalgias, back pain, swelling SKIN:  rashes, color changes, pallor NEURO:  numbness, weakness, tingling, seizures, dizziness, tremors PSYCH:  depression, anxiety, behavioral problems, confusion  HEME/LYMPH:  easy  bruising or bleeding ENDO:  heat/cold intolerance  Past Obstetrical History: 2x SVD, 1x MAB late 1st trimester  Past Gynecologic History: D&C for MAB  Past Medical History: Past Medical History:  Diagnosis Date  . Obesity   . Transverse myelitis [facility])     Past Surgical History:   Past Surgical History:  Procedure Laterality Date  . DILATION AND CURETTAGE OF UTERUS    . EYE SURGERY      Family History:  family history includes Hyperlipidemia in her mother; Hypertension in her mother.  Social History:  Social History   Social History  . Marital status: Married    Spouse name: N/A  . Number of children: N/A  . Years of education: N/A   Occupational History  . Not on file.   Social History Main Topics  . Smoking status: Never Smoker  . Smokeless tobacco: Never Used  . Alcohol use No  . Drug use: No  . Sexual activity: Not on file   Other Topics Concern  . Not on file   Social History Narrative  . No narrative on file    Home Medications:  Medications reconciled in EPIC  No current facility-administered medications on file prior to encounter.    No current outpatient prescriptions on file prior to encounter.    Allergies:  No Known Allergies  Physical Exam:  Temp:  [98 F (36.7 C)-99.2 F (37.3 C)] 98.5 F (36.9 C) (04/06 1115) Pulse Rate:  [95-110] 95 (04/06 1115) Resp:  [18-28] 18 (04/06 1115) BP: (112-148)/(48-75) 129/68 (  04/06 1115) SpO2:  [94 %-100 %] 99 % (04/06 1115) Weight:  [136.1 kg (300 lb)] 136.1 kg (300 lb) (04/06 0129)   General Appearance:  Well developed, well nourished, no acute distress, alert and oriented, cooperative and appears stated age MORBIDLY OBESE HEENT:  Normocephalic atraumatic, extraocular movements intact, moist mucous membranes, neck supple with midline trachea and thyroid without masses Cardiovascular:  Normal S1/S2, regular rate and rhythm, no murmurs, 2+ distal pulses Pulmonary:  clear to auscultation, no  wheezes, rales or rhonchi, symmetric air entry, good air exchange Abdomen:  Bowel sounds present, soft, nontender, nondistended, no abnormal masses or organomegaly, no epigastric pain Extremities:  extremities normal, no tenderness, atraumatic, no cyanosis or edema Skin:  normal coloration and turgor, no rashes, no suspicious skin lesions noted  Neurologic:  Cranial nerves 2-12 grossly intact, grossly equal strength and muscle tone, normal speech, no focal findings or movement disorder noted. Psychiatric:  Normal mood and affect, appropriate, no AH/VH Pelvic:  Deferred for OR  Labs/Studies:   Results for orders placed or performed during the hospital encounter of 02/16/17 (from the past 24 hour(s))  CBC     Status: Abnormal   Collection Time: 02/16/17  1:29 AM  Result Value Ref Range   WBC 14.6 (H) 3.6 - 11.0 K/uL   RBC 2.26 (L) 3.80 - 5.20 MIL/uL   Hemoglobin 5.0 (L) 12.0 - 16.0 g/dL   HCT 16.1 (L) 09.6 - 04.5 %   MCV 71.9 (L) 80.0 - 100.0 fL   MCH 22.0 (L) 26.0 - 34.0 pg   MCHC 30.6 (L) 32.0 - 36.0 g/dL   RDW 40.9 (H) 81.1 - 91.4 %   Platelets 526 (H) 150 - 440 K/uL  ABO/Rh     Status: None   Collection Time: 02/16/17  1:29 AM  Result Value Ref Range   ABO/RH(D) B POS   POC Urine Pregnancy, ED     Status: None   Collection Time: 02/16/17  1:44 AM  Result Value Ref Range   Preg Test, Ur Negative Negative  Type and screen [facility]     Status: None (Preliminary result)   Collection Time: 02/16/17  2:09 AM  Result Value Ref Range   ABO/RH(D) B POS    Antibody Screen NEG    Sample Expiration 02/19/2017    Unit Number [id number]    Blood Component Type RCLI PHER 2    Unit division 00    Status of Unit REL FROM [facility]    Transfusion Status OK TO TRANSFUSE    Crossmatch Result Compatible    Unit Number [id number]    Blood Component Type RBC LR PHER1    Unit division 00    Status of Unit ISSUED    Transfusion Status OK TO TRANSFUSE    Crossmatch  Result Compatible    Unit Number [id number]    Blood Component Type RED CELLS,LR    Unit division 00    Status of Unit ISSUED    Transfusion Status OK TO TRANSFUSE    Crossmatch Result Compatible    Unit Number [id number]    Blood Component Type RED CELLS,LR    Unit division 00    Status of Unit ALLOCATED    Transfusion Status OK TO TRANSFUSE    Crossmatch Result Compatible   Prepare RBC     Status: None   Collection Time: 02/16/17  2:54 AM  Result Value Ref Range   Order Confirmation ORDER PROCESSED BY BLOOD BANK  US Transvaginal Non-ob  Result Date: 02/16/2017 CLINICAL DATA:  Vaginal bleeding for 1 month.  Anemia. EXAM: TRANSABDOMINAL AND TRANSVAGINAL ULTRASOUND OF PELVIS TECHNIQUE: Both transabdominal and transvaginal ultrasound examinations of the pelvis were performed. Transabdominal technique was performed for global imaging of the pelvis including uterus, ovaries, adnexal regions, and pelvic cul-de-sac. It was necessary to proceed with endovaginal exam following the transabdominal exam to visualize the ovaries. COMPARISON:  None FINDINGS: Uterus Measurements: 9.4 x 5.8 x 6.7 cm. No fibroids or other mass visualized. Endometrium Thickness: 2.6 cm.  Complex material in the canal, likely blood. Right ovary Measurements: 2.5 x 1.7 x 2.1 cm. Normal appearance/no adnexal mass. Left ovary Measurements: 3.3 x 2.1 x 2.2 cm. Normal appearance/no adnexal mass. Other findings No abnormal free fluid. IMPRESSION: No myometrial or endometrial masses are evident. Complex material in the endometrial canal probably represents blood. Normal ovaries. Electronically Signed   By: Ellery Plunk M.D.   On: 02/16/2017 04:02      Assessment / Plan:   Jessica Cisneros is a 41 y.o. who presents with profound anemia due to chronic blood loss  1. She is being transfused 3units PRBCs from ED orders - will continue 2. The ultrasound with 2.6cm EE needs to be evacuated - recommended D&C.  Risks and  benefits of procedure discussed at length with patient.  Will add on to OR schedule today.  3. After D&C will treat with high-dose progesterone until pathology returns.   Thank you for the opportunity to be involved with this patient's care.  ----- Ranae Plumber, MD Attending Obstetrician and Gynecologist [facility], Department of OB/GYN [facility]

## 2017-02-16 NOTE — ED Notes (Signed)
ED Provider at bedside. 

## 2017-02-16 NOTE — Anesthesia Postprocedure Evaluation (Signed)
Anesthesia Post Note  Patient: Jessica Cisneros  Procedure(s) Performed: Procedure(s) (LRB): DILATATION AND CURETTAGE (N/A)  Patient location during evaluation: PACU Anesthesia Type: General Level of consciousness: awake and alert Pain management: pain level controlled Vital Signs Assessment: post-procedure vital signs reviewed and stable Respiratory status: spontaneous breathing, nonlabored ventilation, respiratory function stable and patient connected to nasal cannula oxygen Cardiovascular status: blood pressure returned to baseline and stable Postop Assessment: no signs of nausea or vomiting Anesthetic complications: no     Last Vitals:  Vitals:   02/16/17 1405 02/16/17 1410  BP:  126/76  Pulse: 94 99  Resp: 12 14  Temp:      Last Pain:  Vitals:   02/16/17 1410  TempSrc:   PainSc: 4                  Rhylynn Perdomo S

## 2017-02-16 NOTE — Anesthesia Procedure Notes (Signed)
Procedure Name: LMA Insertion Date/Time: 02/16/2017 12:45 PM Performed by: Henrietta Hoover Pre-anesthesia Checklist: Patient identified, Emergency Drugs available, Suction available, Patient being monitored and Timeout performed Patient Re-evaluated:Patient Re-evaluated prior to inductionOxygen Delivery Method: Circle system utilized Preoxygenation: Pre-oxygenation with 100% oxygen Intubation Type: IV induction Ventilation: Mask ventilation without difficulty LMA: LMA inserted LMA Size: 4.0 Number of attempts: 1 Placement Confirmation: ETT inserted through vocal cords under direct vision,  positive ETCO2 and breath sounds checked- equal and bilateral Tube secured with: Tape Dental Injury: Teeth and Oropharynx as per pre-operative assessment

## 2017-02-16 NOTE — ED Triage Notes (Signed)
Pt ambulatory to triage in NAD, reports vag bleeding x 1 month, states feels like she may be becoming anemic.  Pt reports clots, states pretty heavy, unable to say how many pads used.

## 2017-02-16 NOTE — ED Notes (Signed)
Per Dr. Manson Passey, pt stable enough to wait for type specific blood on floor.  Blood bank notified.

## 2017-02-16 NOTE — Anesthesia Preprocedure Evaluation (Signed)
Anesthesia Evaluation  Patient identified by MRN, date of birth, ID band Patient awake    Reviewed: Allergy & Precautions, NPO status , Patient's Chart, lab work & pertinent test results, reviewed documented beta blocker date and time   Airway Mallampati: III  TM Distance: >3 FB     Dental  (+) Chipped   Pulmonary           Cardiovascular      Neuro/Psych    GI/Hepatic   Endo/Other  Morbid obesity  Renal/GU      Musculoskeletal   Abdominal   Peds  Hematology   Anesthesia Other Findings   Reproductive/Obstetrics                             Anesthesia Physical Anesthesia Plan  ASA: III  Anesthesia Plan: General   Post-op Pain Management:    Induction: Intravenous  Airway Management Planned: LMA  Additional Equipment:   Intra-op Plan:   Post-operative Plan:   Informed Consent: I have reviewed the patients History and Physical, chart, labs and discussed the procedure including the risks, benefits and alternatives for the proposed anesthesia with the patient or authorized representative who has indicated his/her understanding and acceptance.     Plan Discussed with: CRNA  Anesthesia Plan Comments:         Anesthesia Quick Evaluation  

## 2017-02-16 NOTE — Anesthesia Post-op Follow-up Note (Cosign Needed)
Anesthesia QCDR form completed.        

## 2017-02-16 NOTE — ED Provider Notes (Signed)
[facility] Emergency Department Provider Note    First MD Initiated Contact with Patient 02/16/17 0206     (approximate)  I have reviewed the triage vital signs and the nursing notes.   HISTORY  Chief Complaint Vaginal Bleeding   HPI Jessica Cisneros is a 41 y.o. female as well as of chronic medical conditions presents to the emergency department with heavy vaginal bleeding times one month associated with dizziness, palpitations fatigue. Patient states this has never occurred before. Patient states that she is going through "many pads per day but could not quantify. patient admits to pelvic discomfort is currently 6 out of 10. Patient denies any fever no dysuria.   Past Medical History:  Diagnosis Date  . Obesity   . Transverse myelitis [facility])     Patient Active Problem List   Diagnosis Date Noted  . Uterine bleeding 02/16/2017  . Edema   . Urinary tract infectious disease   . Paresthesias   . Transverse myelitis (HCC)   . Low serum vitamin D 12/05/2014  . Paresthesia 12/04/2014  . PCOS (polycystic ovarian syndrome) 12/04/2014  . Obesity 12/04/2014    Past Surgical History:  Procedure Laterality Date  . DILATION AND CURETTAGE OF UTERUS    . EYE SURGERY      Prior to Admission medications   Not on File    Allergies Patient has no known allergies.  Family History  Problem Relation Age of Onset  . Hyperlipidemia Mother   . Hypertension Mother     Social History Social History  Substance Use Topics  . Smoking status: Never Smoker  . Smokeless tobacco: Never Used  . Alcohol use No    Review of Systems Constitutional: No fever/chills Eyes: No visual changes. ENT: No sore throat. Cardiovascular: Denies chest pain.Positive for "heart racing" Respiratory: Denies shortness of breath. Gastrointestinal: No abdominal pain.  No nausea, no vomiting.  No diarrhea.  No constipation. Genitourinary: Negative for dysuria.Positive for  menorrhagia Musculoskeletal: Negative for back pain. Skin: Negative for rash. Neurological: Negative for headaches, focal weakness or numbness. Positive for dizziness and fatigue  10-point ROS otherwise negative.  ____________________________________________   PHYSICAL EXAM:  VITAL SIGNS: ED Triage Vitals [02/16/17 0129]  Enc Vitals Group     BP (!) 148/75     Pulse Rate (!) 105     Resp 20     Temp 98.3 F (36.8 C)     Temp Source Oral     SpO2 100 %     Weight 300 lb (136.1 kg)     Height 5\' 4"  (1.626 m)     Head Circumference      Peak Flow      Pain Score 0     Pain Loc      Pain Edu?      Excl. in GC?     Constitutional: Alert and oriented. Well appearing and in no acute distress. Eyes: Conjunctivae are Pale. PERRL. EOMI. Head: Atraumatic. Mouth/Throat: Mucous membranes are moist.Oropharynx non-erythematous. Neck: No stridor.   Cardiovascular: Normal rate, regular rhythm. Good peripheral circulation. Grossly normal heart sounds. Respiratory: Normal respiratory effort.  No retractions. Lungs CTAB. Gastrointestinal: Soft and nontender. No distention.  Musculoskeletal: No lower extremity tenderness nor edema. No gross deformities of extremities. Neurologic:  Normal speech and language. No gross focal neurologic deficits are appreciated.  Skin:  Skin is warm, dry and intact. No rash noted.   ____________________________________________   LABS (all labs ordered are listed,  but only abnormal results are displayed)  Labs Reviewed  CBC - Abnormal; Notable for the following:       Result Value   WBC 14.6 (*)    RBC 2.26 (*)    Hemoglobin 5.0 (*)    HCT 16.2 (*)    MCV 71.9 (*)    MCH 22.0 (*)    MCHC 30.6 (*)    RDW 16.7 (*)    Platelets 526 (*)    All other components within normal limits  POC URINE PREG, ED  TYPE AND SCREEN  ABO/RH  PREPARE RBC (CROSSMATCH)    RADIOLOGY I, [place] Dhiya Smits, personally viewed and evaluated these images (plain  radiographs) as part of my medical decision making, as well as reviewing the written report by the radiologist.  US Transvaginal Non-ob  Result Date: 02/16/2017 CLINICAL DATA:  Vaginal bleeding for 1 month.  Anemia. EXAM: TRANSABDOMINAL AND TRANSVAGINAL ULTRASOUND OF PELVIS TECHNIQUE: Both transabdominal and transvaginal ultrasound examinations of the pelvis were performed. Transabdominal technique was performed for global imaging of the pelvis including uterus, ovaries, adnexal regions, and pelvic cul-de-sac. It was necessary to proceed with endovaginal exam following the transabdominal exam to visualize the ovaries. COMPARISON:  None FINDINGS: Uterus Measurements: 9.4 x 5.8 x 6.7 cm. No fibroids or other mass visualized. Endometrium Thickness: 2.6 cm.  Complex material in the canal, likely blood. Right ovary Measurements: 2.5 x 1.7 x 2.1 cm. Normal appearance/no adnexal mass. Left ovary Measurements: 3.3 x 2.1 x 2.2 cm. Normal appearance/no adnexal mass. Other findings No abnormal free fluid. IMPRESSION: No myometrial or endometrial masses are evident. Complex material in the endometrial canal probably represents blood. Normal ovaries. Electronically Signed   By: Ellery Plunk M.D.   On: 02/16/2017 04:02   US Pelvis Complete  Result Date: 02/16/2017 CLINICAL DATA:  Vaginal bleeding for 1 month.  Anemia. EXAM: TRANSABDOMINAL AND TRANSVAGINAL ULTRASOUND OF PELVIS TECHNIQUE: Both transabdominal and transvaginal ultrasound examinations of the pelvis were performed. Transabdominal technique was performed for global imaging of the pelvis including uterus, ovaries, adnexal regions, and pelvic cul-de-sac. It was necessary to proceed with endovaginal exam following the transabdominal exam to visualize the ovaries. COMPARISON:  None FINDINGS: Uterus Measurements: 9.4 x 5.8 x 6.7 cm. No fibroids or other mass visualized. Endometrium Thickness: 2.6 cm.  Complex material in the canal, likely blood. Right ovary  Measurements: 2.5 x 1.7 x 2.1 cm. Normal appearance/no adnexal mass. Left ovary Measurements: 3.3 x 2.1 x 2.2 cm. Normal appearance/no adnexal mass. Other findings No abnormal free fluid. IMPRESSION: No myometrial or endometrial masses are evident. Complex material in the endometrial canal probably represents blood. Normal ovaries. Electronically Signed   By: Ellery Plunk M.D.   On: 02/16/2017 04:02     Procedures  CRITICAL CARE Performed by: Darci Current   Total critical care time: 45 minutes  Critical care time was exclusive of separately billable procedures and treating other patients.  Critical care was necessary to treat or prevent imminent or life-threatening deterioration.  Critical care was time spent personally by me on the following activities: development of treatment plan with patient and/or surrogate as well as nursing, discussions with consultants, evaluation of patient's response to treatment, examination of patient, obtaining history from patient or surrogate, ordering and performing treatments and interventions, ordering and review of laboratory studies, ordering and review of radiographic studies, pulse oximetry and re-evaluation of patient's condition.   ____________________________________________   INITIAL IMPRESSION / ASSESSMENT AND PLAN / ED COURSE  Pertinent labs & imaging results that were available during my care of the patient were reviewed by me and considered in my medical decision making (see chart for details).   41 year old female presenting to the emergency department menorrhagia symptomatic anemia with a hemoglobin of 5. Ultrasound revealed thickened endometrium with complex material within the endometrial cavity. Given the patient's symptomatic anemia discussed with patient at length the need for back crit blood cell transfusion to which she is in agreement. Patient discussed with Dr. Elesa Massed OB/GYN who will admit the patient for further  evaluation and management.      ____________________________________________  FINAL CLINICAL IMPRESSION(S) / ED DIAGNOSES  Final diagnoses:  Menorrhagia  Symptomatic anemia  MEDICATIONS GIVEN DURING THIS VISIT:  Medications  acetaminophen (TYLENOL) tablet 650 mg (not administered)  acetaminophen (TYLENOL) tablet 1,000 mg (not administered)  diphenhydrAMINE (BENADRYL) capsule 25 mg (not administered)  furosemide (LASIX) tablet 10 mg (not administered)  0.9 %  sodium chloride infusion ( Intravenous New Bag/Given 02/16/17 0540)     NEW OUTPATIENT MEDICATIONS STARTED DURING THIS VISIT:  There are no discharge medications for this patient.   There are no discharge medications for this patient.   There are no discharge medications for this patient.    Note:  This document was prepared using Dragon voice recognition software and may include unintentional dictation errors.    Darci Current, MD 02/16/17 [phone]

## 2017-02-16 NOTE — Addendum Note (Signed)
Addendum  created 02/16/17 1437 by Henrietta Hoover, CRNA   Anesthesia Staff edited

## 2017-02-16 NOTE — Transfer of Care (Signed)
Immediate Anesthesia Transfer of Care Note  Patient: Jessica Cisneros  Procedure(s) Performed: Procedure(s): DILATATION AND CURETTAGE (N/A)  Patient Location: PACU  Anesthesia Type:General  Level of Consciousness: awake  Airway & Oxygen Therapy: Patient Spontanous Breathing and Patient connected to face mask oxygen  Post-op Assessment: Report given to RN and Post -op Vital signs reviewed and stable  Post vital signs: Reviewed and stable  Last Vitals:  Vitals:   02/16/17 1200 02/16/17 1338  BP: 127/71 110/63  Pulse: 92 (!) 109  Resp: 18 14  Temp: 36.3 C 37.4 C    Last Pain:  Vitals:   02/16/17 1200  TempSrc: Tympanic  PainSc:          Complications: No apparent anesthesia complications

## 2017-02-17 ENCOUNTER — Encounter: Payer: Self-pay | Admitting: Obstetrics & Gynecology

## 2017-02-17 LAB — HEMOGLOBIN AND HEMATOCRIT, BLOOD
HCT: 27.9 % — ABNORMAL LOW (ref 35.0–47.0)
HEMOGLOBIN: 9.3 g/dL — AB (ref 12.0–16.0)

## 2017-02-17 MED ORDER — MEGESTROL ACETATE 40 MG PO TABS: 40.0000 mg | ORAL_TABLET | Freq: Two times a day (BID) | ORAL | 4 refills | Status: DC

## 2017-02-17 NOTE — Progress Notes (Signed)
Progress note:  s/p D&C earlier today, 3u PRBC, just started 40 BID Megace  S: feeling better, not sure if she is SOB still or not, but is not dizzy or lightheaded/weak when she walks to bathroom.  She is tolerating PO and has minimal pain from D&C.  Still having bleeding.  She doesn't know if her SOB is her baseline or if this is worse than, say, a month, or a year ago.  O: 98.2 132/64 98 22   @ 1900  H/H:  8.9/26.9  A/P:  40yo s/p D&C and 3u PRBC for profound anemia due to acute on chronic blood loss.  1. Bleeding lessening since D&C and will continue to do so - she should not expect the bleeding to abruptly stop, but to wean off.  Megace 40 BID which can be increased to 80 BID if needed for bleeding control and estrogen opposition.  2. Have patient walk around nursing station to see if SOB continues (more than subjective baseline), and if so will give one more unit.  Explained to patient the risks vs benefits of blood products and my preference is to not give any more right now, but if she feels she is still symptomatic then I will give one more unit.  ----- Ranae Plumber, MD Attending Obstetrician and Gynecologist [facility], Department of OB/GYN [facility]

## 2017-02-17 NOTE — Progress Notes (Signed)
D/C order from Dr. Elesa Massed.  Reviewed d/c instructions and prescriptions with patient and answered any questions.  Patient d/c home via wheelchair by staff.

## 2017-02-17 NOTE — Op Note (Signed)
Operative Report Dilation and Curettage 02/16/2017  Patient:  Jessica Cisneros  41 y.o. female Preoperative diagnosis:  abnormal uterine bleeding Postoperative diagnosis:  abnormal uterine bleeding, thickened endometrium  PROCEDURE:  Procedure(s): DILATATION AND CURETTAGE (N/A) Surgeon:  Moishe Spice) and Role:    * Taaliyah Delpriore Salena Saner Makya Yurko, MD - Primary Anesthesia:  LMA I/O: Specimens:  Endometrial curettings Complications: None Apparent Disposition:  VS stable to PACU  Findings: Rectovaginal exam necessary due to body habitus.  Uterus, mobile, normal size, sounding to 8 cm; normal cervix, vagina, perineum. No acutely active bleeding from cervical os.  Copious edematous endometrial material.  Indication for procedure/Consents: 40 y.o.  Morbidly obese female with a 1month history of irregular bleeding, increasing, and found to be profoundly anemic, admitted, and transfused 3uPRBCs, found to have a heterogenous endometrial stripe of 2.6cm.  Risks of surgery were discussed with the patient including but not limited to: infection which may require antibiotics; injury to uterus or surrounding organs; intrauterine scarring which may impair future fertility; need for additional procedures including laparotomy or laparoscopy; and other postoperative/anesthesia complications. Written informed consent was obtained.    Procedure Details:   The patient was then taken to the operating room where anesthesia was administered and was found to be adequate.  After a formal and adequate timeout was performed, she was placed in the dorsal lithotomy position and examined with the above findings. She was then prepped and draped in the sterile manner.  A speculum was then placed in the patient's vagina and a single tooth tenaculum was applied to the anterior lip of the cervix.    The uterus was sounded to 8cm. Her cervix was dilated with pratt dilators.  A sharp curettage was then performed until there was a gritty texture in all  four quadrants. The specimen was handed off to nursing.  The tenaculum was removed from the anterior lip of the cervix. Brisk bleeding from tenaculum site was not controlled with tamponade and a figure of eight stitch of 3-0 vicry was placed.  The vaginal speculum was removed after noting good hemostasis. The patient tolerated the procedure well and was taken to the recovery area awake, extubated and in stable condition.   Ranae Plumber, MD [facility]YN Attending Gynecologist

## 2017-02-17 NOTE — Progress Notes (Signed)
Patient says she was still SOB with walking nurses station, so will transfuse 1 more unite PRBC.   She will be discharged home in the morning on 40BID megace and will follow up with me next week in the office to review pathology and decide a plan.   ----- Ranae Plumber, MD Attending Obstetrician and Gynecologist [facility], Department of OB/GYN [facility]

## 2017-02-17 NOTE — Discharge Instructions (Signed)
You should expect to have some cramping and vaginal bleeding for about a week. This should taper off and subside, much like a period. If heavy bleeding continues or gets worse, you should contact the office for an earlier appointment.   You have been started on a medication called Megace, 40mg  twice a day.  Please take this and we can discuss need to alter the regimen at your follow up visit.  Please call the office or physician on call for fever >101, severe pain, and heavy bleeding.   [phone]  NOTHING IN THE VAGINA FOR 2 WEEKS!!

## 2017-02-17 NOTE — Discharge Summary (Signed)
Gynecology Physician Postoperative Discharge Summary  Patient ID: Jessica Cisneros MRN: [id number] DOB/AGE: 02-07-1976 41 y.o.  Admit Date: 02/16/2017 Discharge Date: 02/17/2017  Admission Diagnoses: profound anemia due to chronic and acute blood loss Preoperative Diagnoses: thickened endometrium, abnormal uterine bleeding Postoperative Diagnoses: same Discharge Diagnoses: profound anemia due to chronic and acute blood loss, thickened endometrium   Procedures: Procedure(s) (LRB): DILATATION AND CURETTAGE (N/A)  CBC Latest Ref Rng & Units 02/16/2017 02/16/2017 12/14/2014  WBC 3.6 - 11.0 K/uL 13.0(H) 14.6(H) -  Hemoglobin 12.0 - 16.0 g/dL 9.3(Z) 1.6(R) 10.2(L)  Hematocrit 35.0 - 47.0 % 26.9(L) 16.2(L) 30.0(L)  Platelets 150 - 440 K/uL 417 526(H) -    Hospital Course:  Jessica Cisneros is a 41 y.o. [id number] who was admitted for profound anemia with Hb of 5.0 from uterine bleeding. She was found to have a 2.6cm thickened endometrium.   She was given 4u PRBCs and was taken to the OR for D&C to evacuate the uterine contents.  Her operation was uncomplicated. For further details about surgery, please refer to the operative report. Patient had an uncomplicated postoperative course. By time of discharge on HD 2, her pain was controlled on oral pain medications; she was ambulating, voiding without difficulty, tolerating regular diet and passing flatus. She was deemed stable for discharge to home.   Discharge Exam: Blood pressure (!) 109/55, pulse 74, temperature 97.7 F (36.5 C), temperature source Oral, resp. rate 18, height 5\' 4"  (1.626 m), weight 136.1 kg (300 lb), SpO2 99 %. General appearance: alert and no distress  Resp: clear to auscultation bilaterally, normal respiratory effort Cardio: regular rate and rhythm  GI: soft, non-tender; bowel sounds normal; no masses, no organomegaly.  Pelvic: some blood on pad  Extremities: extremities normal, atraumatic, no cyanosis or edema and Homans sign is negative,  no sign of DVT  Discharged Condition: Stable  Disposition: 01-Home or Self Care  Discharge Instructions    Diet - low sodium heart healthy    Complete by:  As directed    Increase activity slowly    Complete by:  As directed      Allergies as of 02/17/2017   No Known Allergies     Medication List    TAKE these medications   megestrol 40 MG tablet Commonly known as:  MEGACE Take 1 tablet (40 mg total) by mouth 2 (two) times daily.      Follow-up Information    Elenora Fender Alasia Enge, MD Follow up in 1 week(s).   Specialty:  Obstetrics and Gynecology Contact information: [address] [place] [address] [phone]           Signed:  Elenora Fender Briah Nary Attending Obstetrician & Gynecologist [facility]

## 2017-02-18 LAB — TYPE AND SCREEN
ABO/RH(D): B POS
ANTIBODY SCREEN: NEGATIVE
UNIT DIVISION: 0
UNIT DIVISION: 0
UNIT DIVISION: 0
UNIT DIVISION: 0
Unit division: 0

## 2017-02-18 LAB — BPAM RBC
BLOOD PRODUCT EXPIRATION DATE: 201804192359
BLOOD PRODUCT EXPIRATION DATE: 201805022359
BLOOD PRODUCT EXPIRATION DATE: 201805022359
Blood Product Expiration Date: 201804152359
Blood Product Expiration Date: 201805022359
ISSUE DATE / TIME: 201804060842
ISSUE DATE / TIME: 201804060526
ISSUE DATE / TIME: 201804061119
ISSUE DATE / TIME: 201804070114
UNIT TYPE AND RH: 7300
UNIT TYPE AND RH: 7300
Unit Type and Rh: 1700
Unit Type and Rh: 5100
Unit Type and Rh: 7300

## 2017-02-20 LAB — SURGICAL PATHOLOGY

## 2019-09-16 ENCOUNTER — Other Ambulatory Visit: Payer: Self-pay

## 2019-09-16 ENCOUNTER — Encounter: Payer: Self-pay | Admitting: *Deleted

## 2019-09-16 DIAGNOSIS — D62 Acute posthemorrhagic anemia: Secondary | ICD-10-CM | POA: Insufficient documentation

## 2019-09-16 DIAGNOSIS — Z6841 Body Mass Index (BMI) 40.0 and over, adult: Secondary | ICD-10-CM | POA: Insufficient documentation

## 2019-09-16 DIAGNOSIS — Z79899 Other long term (current) drug therapy: Secondary | ICD-10-CM | POA: Insufficient documentation

## 2019-09-16 DIAGNOSIS — N939 Abnormal uterine and vaginal bleeding, unspecified: Principal | ICD-10-CM | POA: Insufficient documentation

## 2019-09-16 DIAGNOSIS — N858 Other specified noninflammatory disorders of uterus: Secondary | ICD-10-CM | POA: Insufficient documentation

## 2019-09-16 DIAGNOSIS — Z20828 Contact with and (suspected) exposure to other viral communicable diseases: Secondary | ICD-10-CM | POA: Insufficient documentation

## 2019-09-16 LAB — CBC WITH DIFFERENTIAL/PLATELET
Abs Immature Granulocytes: 0.04 10*3/uL (ref 0.00–0.07)
Basophils Absolute: 0.1 10*3/uL (ref 0.0–0.1)
Basophils Relative: 1 %
Eosinophils Absolute: 0.1 10*3/uL (ref 0.0–0.5)
Eosinophils Relative: 1 %
HCT: 27.5 % — ABNORMAL LOW (ref 36.0–46.0)
Hemoglobin: 8 g/dL — ABNORMAL LOW (ref 12.0–15.0)
Immature Granulocytes: 0 %
Lymphocytes Relative: 18 %
Lymphs Abs: 2.2 10*3/uL (ref 0.7–4.0)
MCH: 20.6 pg — ABNORMAL LOW (ref 26.0–34.0)
MCHC: 29.1 g/dL — ABNORMAL LOW (ref 30.0–36.0)
MCV: 70.9 fL — ABNORMAL LOW (ref 80.0–100.0)
Monocytes Absolute: 0.7 10*3/uL (ref 0.1–1.0)
Monocytes Relative: 5 %
Neutro Abs: 9.3 10*3/uL — ABNORMAL HIGH (ref 1.7–7.7)
Neutrophils Relative %: 75 %
Platelets: 534 10*3/uL — ABNORMAL HIGH (ref 150–400)
RBC: 3.88 MIL/uL (ref 3.87–5.11)
RDW: 17.6 % — ABNORMAL HIGH (ref 11.5–15.5)
WBC: 12.4 10*3/uL — ABNORMAL HIGH (ref 4.0–10.5)
nRBC: 0 % (ref 0.0–0.2)

## 2019-09-16 LAB — URINALYSIS, COMPLETE (UACMP) WITH MICROSCOPIC
RBC / HPF: >50 RBC/hpf — ABNORMAL HIGH (ref 0–5)
Specific Gravity, Urine: 1.02 (ref 1.005–1.030)

## 2019-09-16 LAB — BASIC METABOLIC PANEL
Anion gap: 11 (ref 5–15)
BUN: 11 mg/dL (ref 6–20)
CO2: 24 mmol/L (ref 22–32)
Calcium: 9.1 mg/dL (ref 8.9–10.3)
Chloride: 104 mmol/L (ref 98–111)
Creatinine, Ser: 0.71 mg/dL (ref 0.44–1.00)
GFR calc Af Amer: >60 mL/min (ref >60)
GFR calc non Af Amer: >60 mL/min (ref >60)
Glucose, Bld: 110 mg/dL — ABNORMAL HIGH (ref 70–99)
Potassium: 3.6 mmol/L (ref 3.5–5.1)
Sodium: 139 mmol/L (ref 135–145)

## 2019-09-16 LAB — POCT PREGNANCY, URINE: Preg Test, Ur: NEGATIVE

## 2019-09-16 NOTE — ED Triage Notes (Signed)
Pt ambulatory to triage.  Pt has vag bleeding for 3 weeks.  Pt reports abd cramping.  No urinary sx.  Pt alert.  Speech clear

## 2019-09-17 ENCOUNTER — Observation Stay: Payer: Self-pay | Admitting: Anesthesiology

## 2019-09-17 ENCOUNTER — Observation Stay
Admission: EM | Admit: 2019-09-17 | Discharge: 2019-09-17 | Disposition: A | Discharge status: Home / Self Care | Payer: Self-pay | Attending: Obstetrics & Gynecology | Admitting: Obstetrics & Gynecology

## 2019-09-17 ENCOUNTER — Encounter
Admission: EM | Disposition: A | Discharge status: Home / Self Care | Payer: Self-pay | Source: Home / Self Care | Attending: Emergency Medicine

## 2019-09-17 ENCOUNTER — Other Ambulatory Visit: Payer: Self-pay

## 2019-09-17 ENCOUNTER — Emergency Department: Payer: Self-pay

## 2019-09-17 DIAGNOSIS — D62 Acute posthemorrhagic anemia: Secondary | ICD-10-CM

## 2019-09-17 DIAGNOSIS — N939 Abnormal uterine and vaginal bleeding, unspecified: Secondary | ICD-10-CM | POA: Diagnosis present

## 2019-09-17 DIAGNOSIS — R58 Hemorrhage, not elsewhere classified: Secondary | ICD-10-CM

## 2019-09-17 HISTORY — PX: DILATION AND CURETTAGE OF UTERUS: SHX78

## 2019-09-17 LAB — CBC
HCT: 26 % — ABNORMAL LOW (ref 36.0–46.0)
Hemoglobin: 7.8 g/dL — ABNORMAL LOW (ref 12.0–15.0)
MCH: 21 pg — ABNORMAL LOW (ref 26.0–34.0)
MCHC: 30 g/dL (ref 30.0–36.0)
MCV: 69.9 fL — ABNORMAL LOW (ref 80.0–100.0)
Platelets: 424 10*3/uL — ABNORMAL HIGH (ref 150–400)
RBC: 3.72 MIL/uL — ABNORMAL LOW (ref 3.87–5.11)
RDW: 18.2 % — ABNORMAL HIGH (ref 11.5–15.5)
WBC: 9.7 10*3/uL (ref 4.0–10.5)
nRBC: 0 % (ref 0.0–0.2)

## 2019-09-17 LAB — PREPARE RBC (CROSSMATCH)

## 2019-09-17 LAB — HEMOGLOBIN AND HEMATOCRIT, BLOOD
HCT: 26.8 % — ABNORMAL LOW (ref 36.0–46.0)
Hemoglobin: 7.6 g/dL — ABNORMAL LOW (ref 12.0–15.0)

## 2019-09-17 LAB — SARS CORONAVIRUS 2 BY RT PCR (HOSPITAL ORDER, PERFORMED IN ~~LOC~~ HOSPITAL LAB): SARS Coronavirus 2: NEGATIVE

## 2019-09-17 SURGERY — DILATION AND CURETTAGE
Anesthesia: General

## 2019-09-17 MED ORDER — ONDANSETRON HCL 4 MG/2ML IJ SOLN
INTRAMUSCULAR | Status: DC | PRN
  Administered 2019-09-17: 4 mg via INTRAVENOUS

## 2019-09-17 MED ORDER — TRANEXAMIC ACID-NACL 1000-0.7 MG/100ML-% IV SOLN
1000.0000 mg | INTRAVENOUS | Status: AC
  Administered 2019-09-17: 1000 mg via INTRAVENOUS
  Filled 2019-09-17: qty 100

## 2019-09-17 MED ORDER — ALUM & MAG HYDROXIDE-SIMETH 200-200-20 MG/5ML PO SUSP: 30.0000 mL | ORAL | Status: DC | PRN

## 2019-09-17 MED ORDER — PROPOFOL 10 MG/ML IV BOLUS
INTRAVENOUS | Status: DC | PRN
  Administered 2019-09-17: 200 mg via INTRAVENOUS
  Administered 2019-09-17: 160 mg via INTRAVENOUS

## 2019-09-17 MED ORDER — OXYCODONE HCL 5 MG/5ML PO SOLN: 5.0000 mg | Freq: Once | ORAL | Status: DC | PRN

## 2019-09-17 MED ORDER — VASOPRESSIN 20 UNIT/ML IV SOLN
INTRAVENOUS | Status: AC
  Filled 2019-09-17: qty 1

## 2019-09-17 MED ORDER — SUGAMMADEX SODIUM 500 MG/5ML IV SOLN
INTRAVENOUS | Status: DC | PRN
  Administered 2019-09-17: 540 mg via INTRAVENOUS

## 2019-09-17 MED ORDER — DEXAMETHASONE SODIUM PHOSPHATE 10 MG/ML IJ SOLN
INTRAMUSCULAR | Status: DC | PRN
  Administered 2019-09-17: 10 mg via INTRAVENOUS

## 2019-09-17 MED ORDER — ACETAMINOPHEN 650 MG RE SUPP
650.0000 mg | Freq: Once | RECTAL | Status: AC
  Administered 2019-09-17: 650 mg via RECTAL
  Filled 2019-09-17: qty 1

## 2019-09-17 MED ORDER — LIDOCAINE HCL (CARDIAC) PF 100 MG/5ML IV SOSY
PREFILLED_SYRINGE | INTRAVENOUS | Status: DC | PRN
  Administered 2019-09-17: 100 mg via INTRAVENOUS

## 2019-09-17 MED ORDER — MEDROXYPROGESTERONE ACETATE 10 MG PO TABS: 10.0000 mg | ORAL_TABLET | Freq: Two times a day (BID) | ORAL | 2 refills | Status: AC

## 2019-09-17 MED ORDER — IBUPROFEN 600 MG PO TABS
600.0000 mg | ORAL_TABLET | Freq: Four times a day (QID) | ORAL | Status: DC | PRN
  Administered 2019-09-17: 600 mg via ORAL
  Filled 2019-09-17: qty 1

## 2019-09-17 MED ORDER — SODIUM CHLORIDE 0.9 % IV SOLN
10.0000 mL/h | Freq: Once | INTRAVENOUS | Status: AC
  Administered 2019-09-17: 10 mL/h via INTRAVENOUS

## 2019-09-17 MED ORDER — MIDAZOLAM HCL 2 MG/2ML IJ SOLN
INTRAMUSCULAR | Status: DC | PRN
  Administered 2019-09-17: 2 mg via INTRAVENOUS

## 2019-09-17 MED ORDER — BELLADONNA ALKALOIDS-OPIUM 16.2-60 MG RE SUPP
RECTAL | Status: AC
  Filled 2019-09-17: qty 1

## 2019-09-17 MED ORDER — TRANEXAMIC ACID 650 MG PO TABS
1300.0000 mg | ORAL_TABLET | Freq: Three times a day (TID) | ORAL | Status: DC
  Filled 2019-09-17 (×4): qty 2

## 2019-09-17 MED ORDER — FENTANYL CITRATE (PF) 100 MCG/2ML IJ SOLN
25.0000 ug | INTRAMUSCULAR | Status: DC | PRN
  Administered 2019-09-17 (×2): 25 ug via INTRAVENOUS

## 2019-09-17 MED ORDER — LACTATED RINGERS IV SOLN
INTRAVENOUS | Status: DC
  Administered 2019-09-17 (×3): via INTRAVENOUS

## 2019-09-17 MED ORDER — ACETAMINOPHEN 10 MG/ML IV SOLN
INTRAVENOUS | Status: AC
  Filled 2019-09-17: qty 100

## 2019-09-17 MED ORDER — OXYCODONE HCL 5 MG PO TABS: 5.0000 mg | ORAL_TABLET | Freq: Once | ORAL | Status: DC | PRN

## 2019-09-17 MED ORDER — SILVER NITRATE-POT NITRATE 75-25 % EX MISC
CUTANEOUS | Status: DC | PRN
  Administered 2019-09-17: 1

## 2019-09-17 MED ORDER — FENTANYL CITRATE (PF) 100 MCG/2ML IJ SOLN
INTRAMUSCULAR | Status: AC
  Filled 2019-09-17: qty 2

## 2019-09-17 MED ORDER — GLYCOPYRROLATE 0.2 MG/ML IJ SOLN
INTRAMUSCULAR | Status: DC | PRN
  Administered 2019-09-17: 0.2 mg via INTRAVENOUS

## 2019-09-17 MED ORDER — ACETAMINOPHEN 650 MG RE SUPP
RECTAL | Status: AC
  Filled 2019-09-17: qty 1

## 2019-09-17 MED ORDER — PROPOFOL 10 MG/ML IV BOLUS
INTRAVENOUS | Status: AC
  Filled 2019-09-17: qty 20

## 2019-09-17 MED ORDER — FENTANYL CITRATE (PF) 100 MCG/2ML IJ SOLN
INTRAMUSCULAR | Status: DC | PRN
  Administered 2019-09-17: 100 ug via INTRAVENOUS

## 2019-09-17 MED ORDER — MEDROXYPROGESTERONE ACETATE 10 MG PO TABS
20.0000 mg | ORAL_TABLET | Freq: Two times a day (BID) | ORAL | Status: DC
  Administered 2019-09-17 (×2): 20 mg via ORAL
  Filled 2019-09-17 (×3): qty 2

## 2019-09-17 MED ORDER — SIMETHICONE 80 MG PO CHEW
80.0000 mg | CHEWABLE_TABLET | Freq: Four times a day (QID) | ORAL | Status: DC | PRN
  Filled 2019-09-17: qty 1

## 2019-09-17 MED ORDER — ROCURONIUM BROMIDE 100 MG/10ML IV SOLN
INTRAVENOUS | Status: DC | PRN
  Administered 2019-09-17: 50 mg via INTRAVENOUS

## 2019-09-17 MED ORDER — MIDAZOLAM HCL 2 MG/2ML IJ SOLN
INTRAMUSCULAR | Status: AC
  Filled 2019-09-17: qty 2

## 2019-09-17 MED ORDER — FENTANYL CITRATE (PF) 100 MCG/2ML IJ SOLN
INTRAMUSCULAR | Status: AC
  Administered 2019-09-17: 15:00:00 25 ug via INTRAVENOUS
  Filled 2019-09-17: qty 2

## 2019-09-17 MED ORDER — ESMOLOL HCL 100 MG/10ML IV SOLN
INTRAVENOUS | Status: DC | PRN
  Administered 2019-09-17: 30 mg via INTRAVENOUS

## 2019-09-17 MED ORDER — ACETAMINOPHEN 325 MG PO TABS: 650.0000 mg | ORAL_TABLET | ORAL | Status: DC | PRN

## 2019-09-17 SURGICAL SUPPLY — 22 items
CATH ROBINSON RED A/P 16FR (CATHETERS) ×3 IMPLANT
COVER WAND RF STERILE (DRAPES) ×1 IMPLANT
DEVICE MYOSURE LITE (MISCELLANEOUS) IMPLANT
DEVICE MYOSURE REACH (MISCELLANEOUS) IMPLANT
ELECT REM PT RETURN 9FT ADLT (ELECTROSURGICAL) ×3
ELECTRODE REM PT RTRN 9FT ADLT (ELECTROSURGICAL) ×1 IMPLANT
GLOVE PI ORTHOPRO 6.5 (GLOVE) ×2
GLOVE PI ORTHOPRO STRL 6.5 (GLOVE) ×1 IMPLANT
GLOVE SURG SYN 6.5 ES PF (GLOVE) ×6 IMPLANT
GLOVE SURG SYN 6.5 PF PI (GLOVE) ×2 IMPLANT
GOWN STRL REUS W/ TWL LRG LVL3 (GOWN DISPOSABLE) ×2 IMPLANT
GOWN STRL REUS W/TWL LRG LVL3 (GOWN DISPOSABLE) ×4
KIT PROCEDURE FLUENT (KITS) IMPLANT
KIT TURNOVER CYSTO (KITS) ×3 IMPLANT
PACK DNC HYST (MISCELLANEOUS) ×3 IMPLANT
PAD OB MATERNITY 4.3X12.25 (PERSONAL CARE ITEMS) ×3 IMPLANT
PAD PREP 24X41 OB/GYN DISP (PERSONAL CARE ITEMS) ×3 IMPLANT
SEAL ROD LENS SCOPE MYOSURE (ABLATOR) ×3 IMPLANT
SOL .9 NS 3000ML IRR  AL (IV SOLUTION) ×2
SOL .9 NS 3000ML IRR UROMATIC (IV SOLUTION) ×1 IMPLANT
TUBING CONNECTING 10 (TUBING) ×2 IMPLANT
TUBING CONNECTING 10' (TUBING) ×1

## 2019-09-17 NOTE — Discharge Instructions (Signed)
Please show this paper to your pharmacy if it is more than the $13.88 price  Get a card by mail Show this free coupon to your pharmacist Discount Drug Coupon Your prescription medroxyprogesterone 10mg  60 tablets Discounted price with this coupon $13.38at [facility] This is your estimated price. The pharmacy will provide exact pricing. Pharmacist info Member ID [id number] [place] [id number] PCN [id number] Customer questions call: [phone] Pharmacist questions call: [phone] Frequently Asked Questions What are GoodRx coupons?GoodRx coupons will help you pay less than the retail price for your prescription. They're free to use and are accepted at participating pharmacies in all states and [place]. How do I use a GoodRx coupon?It's similar to using a coupon at a grocery store. Just show it to the pharmacist when you pick up your prescription. The pharmacist will enter the numbers on the coupon into their system for the discount. What if my pharmacy cannot process this coupon?This coupon is valid at participating pharmacies in all states and [place]. If your pharmacy is in the network but unable or unwilling to process your discount, please have the pharmacist call the number on the coupon. Can I use this coupon if I have health insurance?The price on the GoodRx coupon may be lower than your health insurance co-pay, but the coupon cannot lower your co-pay. Ask your pharmacist to help you find the best possible price. Remember this coupon: Will work at [employer] (price may vary) Can be used for all of your familys prescriptions Has no fees or obligations -- its free to use For the pharmacist This coupon displays a contracted rate based on agreements between your pharmacy or purchasing group and a Chief Technology Officer (PBM). Please use the above BIN, PCN and Group number to adjudicate. Questions? Please call [phone].

## 2019-09-17 NOTE — Anesthesia Post-op Follow-up Note (Signed)
Anesthesia QCDR form completed.        

## 2019-09-17 NOTE — ED Notes (Signed)
Pt in US still

## 2019-09-17 NOTE — Progress Notes (Signed)
Pt discharged at this time; RN (from L&D) escorted pt via wheelchair to front entrance; pt going home with significant other; pt leaving hospital via taxi; voucher given to taxi driver

## 2019-09-17 NOTE — ED Notes (Signed)
Patient transported from Ultrasound 

## 2019-09-17 NOTE — Consult Note (Signed)
Contacted by ED MD concerning Ms. Dazey Has been having abnormal uterine bleeding x 2 weeks, passing large clots.  She arrived to ED her hemoglobin was 8.0 on presentation and then fell to 7.6.  ED MD ordered blood and called me for admission for blood transfusion.  She has an abnormally thickened endometrial stripe, of 1.6cm.  She will need dilation and curettage.  We will start by repeleting her blood losses, and stabilizing her bleeding with lysteda and progesterone.    Formal H&P to follow.  ----- Larey Days, MD, [place] Attending Obstetrician and Gynecologist [facility], Department of [facility]

## 2019-09-17 NOTE — Transfer of Care (Signed)
Immediate Anesthesia Transfer of Care Note  Patient: Jessica Cisneros  Procedure(s) Performed: DILATATION AND CURETTAGE (N/A )  Patient Location: PACU  Anesthesia Type:General  Level of Consciousness: awake, alert , oriented and patient cooperative  Airway & Oxygen Therapy: Patient Spontanous Breathing and Patient connected to face mask oxygen  Post-op Assessment: Report given to RN and Post -op Vital signs reviewed and stable  Post vital signs: Reviewed and stable  Last Vitals:  Vitals Value Taken Time  BP 107/48 09/17/19 1406  Temp 36.6 C 09/17/19 1406  Pulse 94 09/17/19 1410  Resp 23 09/17/19 1410  SpO2 100 % 09/17/19 1410  Vitals shown include unvalidated device data.  Last Pain:  Vitals:   09/17/19 1406  TempSrc:   PainSc: 5          Complications: No apparent anesthesia complications

## 2019-09-17 NOTE — Progress Notes (Signed)
To OR

## 2019-09-17 NOTE — Progress Notes (Signed)
Discharge instructions and prescriptions given and reviewed with patient. Patient verbalized understanding.   Pt calling around to find ride home. Family member states "I'll come but it'll be a while." Nursing supervisor, Leisure centre manager, notified of potential need for taxi voucher r/t patient having surgery today and drove herself to hospital and unable to drive home. Voucher received, per nursing supervisor pt can wait until 2030 for ride, if not here then we can use taxi voucher. Will report to oncoming RN.

## 2019-09-17 NOTE — Anesthesia Procedure Notes (Signed)
Procedure Name: Intubation Performed by: Kelton Pillar, CRNA Pre-anesthesia Checklist: Emergency Drugs available, Patient identified, Suction available and Patient being monitored Patient Re-evaluated:Patient Re-evaluated prior to induction Oxygen Delivery Method: Circle system utilized Preoxygenation: Pre-oxygenation with 100% oxygen Induction Type: IV induction Ventilation: Mask ventilation without difficulty Laryngoscope Size: McGraph and 4 Grade View: Grade I Tube type: Oral Tube size: 7.0 mm Number of attempts: 1 Airway Equipment and Method: Patient positioned with wedge pillow and Stylet Placement Confirmation: CO2 detector,  ETT inserted through vocal cords under direct vision,  positive ETCO2 and breath sounds checked- equal and bilateral Secured at: 21 cm Tube secured with: Tape Dental Injury: Teeth and Oropharynx as per pre-operative assessment

## 2019-09-17 NOTE — H&P (Addendum)
Consult History and Physical   SERVICE: Gynecology   Patient Name: Jessica Cisneros Patient MRN:   [id number]  CC: vaginal bleeding  HPI: [name] is a 43 y.o.. with morbid obesity and history of thickened endometrium, heavy bleeding, and severe anemia presented to the ED this morning with heavy vaginal bleeding, passing clots, and feeling weak.  Location: uterus Onset/timing: last couple days Duration: same Quality:  Heavy bleeding Severity: severe Aggravating or alleviating conditions: nothing has made better or worse Associated signs/symptoms: some weakness, but otherwise feels fine.  No tissue, no pregnancy, no weight gain or loss, no change in appetite. Context: [name] has a history of presenting 2 years ago for this similar set of symptoms, had a D&C by me, and was lost to follow up.  Her endometrium did not show hyperplasia or malignancy.  She was more anemic then, but recognized these symptoms as the same and presented to the ED for evaluation and treatment.     Review of Systems: positives in bold GEN:   fevers, chills, weight changes, appetite changes, fatigue, night sweats HEENT:  HA, vision changes, hearing loss, congestion, rhinorrhea, sinus pressure, dysphagia CV:   CP, palpitations PULM:  SOB, cough GI:  abd pain, N/V/D/C GU:  dysuria, urgency, frequency MSK:  arthralgias, myalgias, back pain, swelling SKIN:  rashes, color changes, pallor NEURO:  numbness, weakness, tingling, seizures, dizziness, tremors PSYCH:  depression, anxiety, behavioral problems, confusion  HEME/LYMPH:  easy bruising or bleeding ENDO:  heat/cold intolerance  Past Obstetrical History: OB History   No obstetric history on file.     Past Gynecologic History: Patient's last menstrual period was 08/27/2019. Menstrual s  Past Medical History: 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(HCC)     Past Surgical History:   Past Surgical History:    Procedure Laterality Date   DILATION AND CURETTAGE OF UTERUS     DILATION AND CURETTAGE OF UTERUS N/A 02/16/2017   Procedure: DILATATION AND CURETTAGE;  Surgeon: Elenora Fender Voyd Groft, MD;  Location: [facility] ORS;  Service: Gynecology;  Laterality: N/A;   EYE SURGERY      Family History:  family history includes Hyperlipidemia in her mother; Hypertension in her mother.  Social History:  Social History   Occupational History   Not on file  Tobacco Use   Smoking status: Never Smoker   Smokeless tobacco: Never Used  Substance and Sexual Activity   Alcohol use: No   Drug use: No   Sexual activity: Not on file     Home Medications:  Medications reconciled in EPIC  No current facility-administered medications on file prior to encounter.    No current outpatient medications on file prior to encounter.    Allergies:  No Known Allergies  Physical Exam:  Temp:  [98.1 F (36.7 C)-98.2 F (36.8 C)] 98.2 F (36.8 C) (11/04 0409) Pulse Rate:  [86-94] 86 (11/04 0409) Resp:  [16-20] 16 (11/04 0409) BP: (135-158)/(60-85) 139/60 (11/04 0409) SpO2:  [100 %] 100 % (11/04 0409) Weight:  [136.1 kg] 136.1 kg (11/03 2005)   General Appearance:  Well developed, well nourished, no acute distress, alert and oriented, cooperative and appears stated age HEENT:  Normocephalic atraumatic, extraocular movements intact, moist mucous membranes, neck supple with midline trachea and thyroid without masses Cardiovascular:  Normal S1/S2, regular rate and rhythm, no murmurs, 2+ distal pulses Pulmonary:  clear to auscultation, no wheezes, rales or rhonchi, symmetric air entry, good air  exchange Abdomen:  Bowel sounds present, soft, nontender, nondistended, no abnormal masses or organomegaly, no epigastric pain Back: inspection of back is normal Extremities:  extremities normal, no tenderness, atraumatic, no cyanosis or edema Skin:  normal coloration and turgor, no rashes, no suspicious skin lesions noted   Neurologic:  Cranial nerves 2-12 grossly intact, grossly equal strength and muscle tone, normal speech, no focal findings or movement disorder noted. Psychiatric:  Normal mood and affect, appropriate, no AH/VH Pelvic: deferred for OR  Labs/Studies:   Results for orders placed or performed during the hospital encounter of 09/17/19 (from the past 24 hour(s))  CBC with Differential     Status: Abnormal   Collection Time: 09/16/19  8:07 PM  Result Value Ref Range   WBC 12.4 (H) 4.0 - 10.5 K/uL   RBC 3.88 3.87 - 5.11 MIL/uL   Hemoglobin 8.0 (L) 12.0 - 15.0 g/dL   HCT 27.5 (L) 36.0 - 46.0 %   MCV 70.9 (L) 80.0 - 100.0 fL   MCH 20.6 (L) 26.0 - 34.0 pg   MCHC 29.1 (L) 30.0 - 36.0 g/dL   RDW 17.6 (H) 11.5 - 15.5 %   Platelets 534 (H) 150 - 400 K/uL   nRBC 0.0 0.0 - 0.2 %   Neutrophils Relative % 75 %   Neutro Abs 9.3 (H) 1.7 - 7.7 K/uL   Lymphocytes Relative 18 %   Lymphs Abs 2.2 0.7 - 4.0 K/uL   Monocytes Relative 5 %   Monocytes Absolute 0.7 0.1 - 1.0 K/uL   Eosinophils Relative 1 %   Eosinophils Absolute 0.1 0.0 - 0.5 K/uL   Basophils Relative 1 %   Basophils Absolute 0.1 0.0 - 0.1 K/uL   Immature Granulocytes 0 %   Abs Immature Granulocytes 0.04 0.00 - 0.07 K/uL  Basic metabolic panel     Status: Abnormal   Collection Time: 09/16/19  8:07 PM  Result Value Ref Range   Sodium 139 135 - 145 mmol/L   Potassium 3.6 3.5 - 5.1 mmol/L   Chloride 104 98 - 111 mmol/L   CO2 24 22 - 32 mmol/L   Glucose, Bld 110 (H) 70 - 99 mg/dL   BUN 11 6 - 20 mg/dL   Creatinine, Ser 0.71 0.44 - 1.00 mg/dL   Calcium 9.1 8.9 - 10.3 mg/dL   GFR calc non Af Amer >60 >60 mL/min   GFR calc Af Amer >60 >60 mL/min   Anion gap 11 5 - 15  Urinalysis, Complete w Microscopic     Status: Abnormal   Collection Time: 09/16/19  8:10 PM  Result Value Ref Range   Color, Urine RED (A) YELLOW   APPearance CLOUDY (A) CLEAR   Specific Gravity, Urine 1.020 1.005 - 1.030   pH  5.0 - 8.0    TEST NOT REPORTED DUE TO  COLOR INTERFERENCE OF URINE PIGMENT   Glucose, UA (A) NEGATIVE mg/dL    TEST NOT REPORTED DUE TO COLOR INTERFERENCE OF URINE PIGMENT   Hgb urine dipstick (A) NEGATIVE    TEST NOT REPORTED DUE TO COLOR INTERFERENCE OF URINE PIGMENT   Bilirubin Urine (A) NEGATIVE    TEST NOT REPORTED DUE TO COLOR INTERFERENCE OF URINE PIGMENT   Ketones, ur (A) NEGATIVE mg/dL    TEST NOT REPORTED DUE TO COLOR INTERFERENCE OF URINE PIGMENT   Protein, ur (A) NEGATIVE mg/dL    TEST NOT REPORTED DUE TO COLOR INTERFERENCE OF URINE PIGMENT   Nitrite (A) NEGATIVE  TEST NOT REPORTED DUE TO COLOR INTERFERENCE OF URINE PIGMENT   Leukocytes,Ua (A) NEGATIVE    TEST NOT REPORTED DUE TO COLOR INTERFERENCE OF URINE PIGMENT   RBC / HPF >50 (H) 0 - 5 RBC/hpf   WBC, UA 21-50 0 - 5 WBC/hpf   Bacteria, UA RARE (A) NONE SEEN   Squamous Epithelial / LPF 11-20 0 - 5   Mucus PRESENT   Pregnancy, urine POC     Status: None   Collection Time: 09/16/19  8:23 PM  Result Value Ref Range   Preg Test, Ur NEGATIVE NEGATIVE  Hemoglobin and hematocrit, blood     Status: Abnormal   Collection Time: 09/17/19  2:04 AM  Result Value Ref Range   Hemoglobin 7.6 (L) 12.0 - 15.0 g/dL   HCT 86.526.8 (L) 78.436.0 - 69.646.0 %  Prepare RBC     Status: None   Collection Time: 09/17/19  3:17 AM  Result Value Ref Range   Order Confirmation      ORDER PROCESSED BY BLOOD BANK Performed at [facility], [address] [place], [place] [address]   SARS Coronavirus 2 by RT PCR (hospital order, performed in [facility]) Nasopharyngeal Nasopharyngeal Swab     Status: None   Collection Time: 09/17/19  3:17 AM   Specimen: Nasopharyngeal Swab  Result Value Ref Range   SARS Coronavirus 2 NEGATIVE NEGATIVE  Type and screen [facility]     Status: None (Preliminary result)   Collection Time: 09/17/19  4:16 AM  Result Value Ref Range   ABO/RH(D) B POS    Antibody Screen NEG    Sample Expiration 09/20/2019,2359     Unit Number [id number]    Blood Component Type RED CELLS,LR    Unit division 00    Status of Unit ISSUED    Transfusion Status OK TO TRANSFUSE    Crossmatch Result      Compatible Performed at [facility], [address] [place], [place] [address]   CBC     Status: Abnormal   Collection Time: 09/17/19 11:17 AM  Result Value Ref Range   WBC 9.7 4.0 - 10.5 K/uL   RBC 3.72 (L) 3.87 - 5.11 MIL/uL   Hemoglobin 7.8 (L) 12.0 - 15.0 g/dL   HCT 03.426.0 (L) 74.236.0 - 59.546.0 %   MCV 69.9 (L) 80.0 - 100.0 fL   MCH 21.0 (L) 26.0 - 34.0 pg   MCHC 30.0 30.0 - 36.0 g/dL   RDW 63.818.2 (H) 75.611.5 - 43.315.5 %   Platelets 424 (H) 150 - 400 K/uL   nRBC 0.0 0.0 - 0.2 %      TVUS:  [place] Pelvic Complete W Transvaginal And Torsion R/o  Result Date: 09/17/2019 CLINICAL DATA:  Initial evaluation for acute vaginal bleeding for 3 weeks. EXAM: TRANSABDOMINAL AND TRANSVAGINAL ULTRASOUND OF PELVIS DOPPLER ULTRASOUND OF OVARIES TECHNIQUE: Both transabdominal and transvaginal ultrasound examinations of the pelvis were performed. Transabdominal technique was performed for global imaging of the pelvis including uterus, ovaries, adnexal regions, and pelvic cul-de-sac. It was necessary to proceed with endovaginal exam following the transabdominal exam to visualize the uterus, endometrium, and ovaries. Color and duplex Doppler ultrasound was utilized to evaluate blood flow to the ovaries. COMPARISON:  None. FINDINGS: Uterus Measurements: 9.3 x 5.6 x 7.3 cm = volume: 195.6 mL. No fibroids or other mass visualized. Endometrium Thickness: 16.2 mm.  No focal abnormality visualized. Right ovary Not visualized.  No adnexal mass. Left ovary  Measurements: 5.0 x 3.2 x 5.1 cm = volume: 42.0 mL. 3.5 x 2.8 x 3.7 cm simple cyst, most consistent with a normal physiologic follicular cyst. Pulsed Doppler evaluation of the left ovary demonstrates normal low-resistance arterial and venous waveforms. Other findings No abnormal free fluid.  IMPRESSION: 1. Endometrial stripe measures 16 mm in thickness. If bleeding remains unresponsive to hormonal or medical therapy, focal lesion work-up with sonohysterogram should be considered. Endometrial biopsy should also be considered in pre-menopausal patients at high risk for endometrial carcinoma. (Ref: Radiological Reasoning: Algorithmic Workup of Abnormal Vaginal Bleeding with Endovaginal Sonography and Sonohysterography. AJR 2008; 333:O32-91). 2. 3.7 cm simple left ovarian cyst, most consistent with a normal physiologic follicular cyst. No associated torsion. This has benign characteristics and is a common finding in premenopausal females. No imaging follow up is required. 3. Nonvisualization of the right ovary.  No right adnexal mass. Electronically Signed   By: Rise Mu M.D.   On: 09/17/2019 01:37      Assessment / Plan:   LEIANNE AUSTEN is a 43 y.o. with abnormal uterine bleeding, symptomatic anemia, and thickened endometrium.  1. Acute on chronic blood loss anemia - from worsening uterine bleeding.  Chronic issue, with now thickened endometrium at 1.6cm.  Will control bleeding for now with Lysteda and progestin.  ED started PRBC transfusion.  2. Patient morbidly obese, likely contributing to growth of endometrial lining.  In 2018 patient had same presentation with copious tissue which was negative for hyperplasia or malignancy.  She was lost to follow up, and was recommended starting a progestin agent to counterbalance the estrogen effect on her uterine lining from her adiposity.  3. Patient has agreed to go to OR for therapeutic and diagnostic D&C.  She will be discharged likely from this procedure.     Thank you for the opportunity to be involved with this patient's care.  ----- Ranae Plumber, MD Attending Obstetrician and Gynecologist [facility], Department of OB/GYN [facility]

## 2019-09-17 NOTE — Progress Notes (Signed)
Pt has done great since arriving back from surgery. A&O x4, up to bathroom independently after set up, voided, ate regular diet, pain under control with Motrin. Dr. Leonides Schanz updated- discharge orders received.   Pt updated. Looking for ride since she drove to hospital and unable to drive I[place] hour after surgery and husband at bedside cannot drive.

## 2019-09-17 NOTE — Progress Notes (Signed)
Pt now would like to go home via taxi cab voucher. [employer] called.

## 2019-09-17 NOTE — Anesthesia Preprocedure Evaluation (Addendum)
Anesthesia Evaluation  Patient identified by MRN, date of birth, ID band Patient awake    Reviewed: Allergy & Precautions, H&P , NPO status , Patient's Chart, lab work & pertinent test results  Airway Mallampati: II  TM Distance: >3 FB Neck ROM: full    Dental  (+) Poor Dentition, Chipped   Pulmonary neg pulmonary ROS,           Cardiovascular negative cardio ROS    Reports occasional chest pain that is not associated with exertion and does not interfere with activity.  She does not see a physician   Neuro/Psych negative neurological ROS  negative psych ROS   GI/Hepatic negative GI ROS, Neg liver ROS,   Endo/Other  Morbid obesity (super morbid obesity)  Renal/GU      Musculoskeletal   Abdominal   Peds  Hematology negative hematology ROS (+)   Anesthesia Other Findings Past Medical History: No date: Obesity No date: Transverse myelitis ([place])  Past Surgical History: No date: DILATION AND CURETTAGE OF UTERUS 02/16/2017: DILATION AND CURETTAGE OF UTERUS; N/A     Comment:  Procedure: DILATATION AND CURETTAGE;  Surgeon: Honor Loh              Ward, MD;  Location: [facility] ORS;  Service: Gynecology;                Laterality: N/A; No date: EYE SURGERY  BMI    Body Mass Index: 51.49 kg/m      Reproductive/Obstetrics negative OB ROS                            Anesthesia Physical Anesthesia Plan  ASA: III  Anesthesia Plan: General ETT   Post-op Pain Management:    Induction:   PONV Risk Score and Plan: Ondansetron, Dexamethasone, Midazolam and Treatment may vary due to age or medical condition  Airway Management Planned:   Additional Equipment:   Intra-op Plan:   Post-operative Plan:   Informed Consent: I have reviewed the patients History and Physical, chart, labs and discussed the procedure including the risks, benefits and alternatives for the proposed anesthesia with the  patient or authorized representative who has indicated his/her understanding and acceptance.     Dental Advisory Given  Plan Discussed with: Anesthesiologist, CRNA and Surgeon  Anesthesia Plan Comments:        Anesthesia Quick Evaluation

## 2019-09-17 NOTE — ED Provider Notes (Signed)
[facility] Emergency Department Provider Note  ____________________________________________  Time seen: Approximately 2:41 AM  I have reviewed the triage vital signs and the nursing notes.   HISTORY  Chief Complaint Vaginal Bleeding   HPI Jessica Cisneros is a 43 y.o. female with a history of abnormal uterine bleeding who presents for evaluation of vaginal bleeding.  Patient reports constant vaginal bleeding and passing large clots for the last 2 weeks. Her symptoms are constant and moderate in intensity.  She has been feeling a little bit fatigued but has not had any dizziness or syncopal events.  She is not on blood thinners.  Patient had similar presentation back in 2018 when she was admitted with a hemoglobin of 5, received a blood transfusion and then underwent a D&C.  At that time she was put on progesterone hormone and did really well.  She reports that her symptoms had resolved until about 2 weeks ago.  She is no longer on progesterone.  She has not followed up with OB/GYN since.  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[facility])     Patient Active Problem List   Diagnosis Date Noted   Uterine bleeding 02/16/2017   Edema    Urinary tract infectious disease    Paresthesias    Transverse myelitis (HCC)    Low serum vitamin D 12/05/2014   Paresthesia 12/04/2014   PCOS (polycystic ovarian syndrome) 12/04/2014   Obesity 12/04/2014    Past Surgical History:  Procedure Laterality Date   DILATION AND CURETTAGE OF UTERUS     DILATION AND CURETTAGE OF UTERUS N/A 02/16/2017   Procedure: DILATATION AND CURETTAGE;  Surgeon: Elenora Fender Ward, MD;  Location: [facility] ORS;  Service: Gynecology;  Laterality: N/A;   EYE SURGERY      Prior to Admission medications   Medication Sig Start Date End Date Taking? Authorizing Provider  megestrol (MEGACE) 40 MG tablet Take 1 tablet (40 mg total) by mouth 2 (two) times daily. 02/17/17    Ward, Elenora Fender, MD    Allergies Patient has no known allergies.  Family History  Problem Relation Age of Onset   Hyperlipidemia Mother    Hypertension Mother     Social History Social History   Tobacco Use   Smoking status: Never Smoker   Smokeless tobacco: Never Used  Substance Use Topics   Alcohol use: No   Drug use: No    Review of Systems  Constitutional: Negative for fever. Eyes: Negative for visual changes. ENT: Negative for sore throat. Neck: No neck pain  Cardiovascular: Negative for chest pain. Respiratory: Negative for shortness of breath. Gastrointestinal: Negative for abdominal pain, vomiting or diarrhea. Genitourinary: Negative for dysuria. + vaginal bleeding Musculoskeletal: Negative for back pain. Skin: Negative for rash. Neurological: Negative for headaches, weakness or numbness. Psych: No SI or HI  ____________________________________________   PHYSICAL EXAM:  VITAL SIGNS: ED Triage Vitals  Enc Vitals Group     BP 09/16/19 2004 (!) 158/60     Pulse Rate 09/16/19 2004 89     Resp 09/16/19 2004 20     Temp 09/16/19 2004 98.1 F (36.7 C)     Temp Source 09/16/19 2004 Oral     SpO2 09/16/19 2004 100 %     Weight 09/16/19 2005 300 lb (136.1 kg)     Height 09/16/19 2005 5\' 4"  (1.626 m)     Head Circumference --      Peak  Flow --      Pain Score 09/16/19 2005 0     Pain Loc --      Pain Edu? --      Excl. in GC? --     Constitutional: Alert and oriented. Well appearing and in no apparent distress. HEENT:      Head: Normocephalic and atraumatic.         Eyes: Conjunctivae are normal. Sclera is non-icteric.       Mouth/Throat: Mucous membranes are moist.       Neck: Supple with no signs of meningismus. Cardiovascular: Regular rate and rhythm. No murmurs, gallops, or rubs. 2+ symmetrical distal pulses are present in all extremities. No JVD. Respiratory: Normal respiratory effort. Lungs are clear to auscultation bilaterally. No  wheezes, crackles, or rhonchi.  Gastrointestinal: Soft, non tender, and non distended with positive bowel sounds. No rebound or guarding. Genitourinary: No CVA tenderness. Musculoskeletal: Nontender with normal range of motion in all extremities. No edema, cyanosis, or erythema of extremities. Neurologic: Normal speech and language. Face is symmetric. Moving all extremities. No gross focal neurologic deficits are appreciated. Skin: Skin is warm, dry and intact. No rash noted. Psychiatric: Mood and affect are normal. Speech and behavior are normal.  ____________________________________________   LABS (all labs ordered are listed, but only abnormal results are displayed)  Labs Reviewed  CBC WITH DIFFERENTIAL/PLATELET - Abnormal; Notable for the following components:      Result Value   WBC 12.4 (*)    Hemoglobin 8.0 (*)    HCT 27.5 (*)    MCV 70.9 (*)    MCH 20.6 (*)    MCHC 29.1 (*)    RDW 17.6 (*)    Platelets 534 (*)    Neutro Abs 9.3 (*)    All other components within normal limits  BASIC METABOLIC PANEL - Abnormal; Notable for the following components:   Glucose, Bld 110 (*)    All other components within normal limits  URINALYSIS, COMPLETE (UACMP) WITH MICROSCOPIC - Abnormal; Notable for the following components:   Color, Urine RED (*)    APPearance CLOUDY (*)    Glucose, UA   (*)    Value: TEST NOT REPORTED DUE TO COLOR INTERFERENCE OF URINE PIGMENT   Hgb urine dipstick   (*)    Value: TEST NOT REPORTED DUE TO COLOR INTERFERENCE OF URINE PIGMENT   Bilirubin Urine   (*)    Value: TEST NOT REPORTED DUE TO COLOR INTERFERENCE OF URINE PIGMENT   Ketones, ur   (*)    Value: TEST NOT REPORTED DUE TO COLOR INTERFERENCE OF URINE PIGMENT   Protein, ur   (*)    Value: TEST NOT REPORTED DUE TO COLOR INTERFERENCE OF URINE PIGMENT   Nitrite   (*)    Value: TEST NOT REPORTED DUE TO COLOR INTERFERENCE OF URINE PIGMENT   Leukocytes,Ua   (*)    Value: TEST NOT REPORTED DUE TO COLOR  INTERFERENCE OF URINE PIGMENT   RBC / HPF >50 (*)    Bacteria, UA RARE (*)    All other components within normal limits  HEMOGLOBIN AND HEMATOCRIT, BLOOD - Abnormal; Notable for the following components:   Hemoglobin 7.6 (*)    HCT 26.8 (*)    All other components within normal limits  POC URINE PREG, ED  POCT PREGNANCY, URINE  TYPE AND SCREEN  PREPARE RBC (CROSSMATCH)   ____________________________________________  EKG  none  ____________________________________________  RADIOLOGY  I have personally reviewed  the images performed during this visit and I agree with the Radiologist's read.   Interpretation by Radiologist:  [place] Pelvic Complete W Transvaginal And Torsion R/o  Result Date: 09/17/2019 CLINICAL DATA:  Initial evaluation for acute vaginal bleeding for 3 weeks. EXAM: TRANSABDOMINAL AND TRANSVAGINAL ULTRASOUND OF PELVIS DOPPLER ULTRASOUND OF OVARIES TECHNIQUE: Both transabdominal and transvaginal ultrasound examinations of the pelvis were performed. Transabdominal technique was performed for global imaging of the pelvis including uterus, ovaries, adnexal regions, and pelvic cul-de-sac. It was necessary to proceed with endovaginal exam following the transabdominal exam to visualize the uterus, endometrium, and ovaries. Color and duplex Doppler ultrasound was utilized to evaluate blood flow to the ovaries. COMPARISON:  None. FINDINGS: Uterus Measurements: 9.3 x 5.6 x 7.3 cm = volume: 195.6 mL. No fibroids or other mass visualized. Endometrium Thickness: 16.2 mm.  No focal abnormality visualized. Right ovary Not visualized.  No adnexal mass. Left ovary Measurements: 5.0 x 3.2 x 5.1 cm = volume: 42.0 mL. 3.5 x 2.8 x 3.7 cm simple cyst, most consistent with a normal physiologic follicular cyst. Pulsed Doppler evaluation of the left ovary demonstrates normal low-resistance arterial and venous waveforms. Other findings No abnormal free fluid. IMPRESSION: 1. Endometrial stripe measures 16  mm in thickness. If bleeding remains unresponsive to hormonal or medical therapy, focal lesion work-up with sonohysterogram should be considered. Endometrial biopsy should also be considered in pre-menopausal patients at high risk for endometrial carcinoma. (Ref: Radiological Reasoning: Algorithmic Workup of Abnormal Vaginal Bleeding with Endovaginal Sonography and Sonohysterography. AJR 2008; 161:W96-04; 191:S68-73). 2. 3.7 cm simple left ovarian cyst, most consistent with a normal physiologic follicular cyst. No associated torsion. This has benign characteristics and is a common finding in premenopausal females. No imaging follow up is required. 3. Nonvisualization of the right ovary.  No right adnexal mass. Electronically Signed   By: Rise MuBenjamin  McClintock M.D.   On: 09/17/2019 01:37     ____________________________________________   PROCEDURES  Procedure(s) performed: None Procedures Critical Care performed: yes  CRITICAL CARE Performed by: Nita Sicklearolina Aeon Kessner  ?  Total critical care time: 30 min  Critical care time was exclusive of separately billable procedures and treating other patients.  Critical care was necessary to treat or prevent imminent or life-threatening deterioration.  Critical care was time spent personally by me on the following activities: development of treatment plan with patient and/or surrogate as well as nursing, discussions with consultants, evaluation of patient's response to treatment, examination of patient, obtaining history from patient or surrogate, ordering and performing treatments and interventions, ordering and review of laboratory studies, ordering and review of radiographic studies, pulse oximetry and re-evaluation of patient's condition.  ____________________________________________   INITIAL IMPRESSION / ASSESSMENT AND PLAN / ED COURSE  43 y.o. female with a history of abnormal uterine bleeding who presents for evaluation of abnormal uterine bleeding.  Patient  again is anemic with a hemoglobin of 8.  While in the emergency room a repeat H&H was done which is trending down to 7.6.  She is otherwise hemodynamically stable.  She is not on blood thinners.  Discussed with Dr. Leeroy Bockhelsea Ward from OB/GYN who recommended admission for a D&C, also recommended 1 g of IV TXA, transfusion of 1 unit of PRBCs and reinitiation of progesterone hormone.  Discussed these recommendations with patient who is in agreement.  Patient will remain n.p.o.    As part of my medical decision making, I reviewed the following data within the electronic MEDICAL RECORD NUMBER Nursing notes reviewed and incorporated, Labs  reviewed , Old chart reviewed, Radiograph reviewed , A consult was requested and obtained from this/these consultant(s) OB/GYN, Notes from prior ED visits and [place] Controlled Substance Database   Patient was evaluated in Emergency Department today for the symptoms described in the history of present illness. Patient was evaluated in the context of the global COVID-19 pandemic, which necessitated consideration that the patient might be at risk for infection with the SARS-CoV-2 virus that causes COVID-19. Institutional protocols and algorithms that pertain to the evaluation of patients at risk for COVID-19 are in a state of rapid change based on information released by regulatory bodies including the CDC and federal and state organizations. These policies and algorithms were followed during the patient's care in the ED.   ____________________________________________   FINAL CLINICAL IMPRESSION(S) / ED DIAGNOSES   Final diagnoses:  Bleeding  Abnormal uterine bleeding (AUB)  Acute blood loss anemia      NEW MEDICATIONS STARTED DURING THIS VISIT:  ED Discharge Orders    None       Note:  This document was prepared using Dragon voice recognition software and may include unintentional dictation errors.    Rudene Re, MD 09/17/19 [phone]

## 2019-09-18 ENCOUNTER — Encounter: Payer: Self-pay | Admitting: Obstetrics & Gynecology

## 2019-09-18 LAB — BPAM RBC
Blood Product Expiration Date: 202011302359
ISSUE DATE / TIME: 202011040600
Unit Type and Rh: 7300

## 2019-09-18 LAB — TYPE AND SCREEN
ABO/RH(D): B POS
Antibody Screen: NEGATIVE
Unit division: 0

## 2019-09-18 NOTE — Anesthesia Postprocedure Evaluation (Signed)
Anesthesia Post Note  Patient: Jessica Cisneros  Procedure(s) Performed: DILATATION AND CURETTAGE (N/A )  Patient location during evaluation: PACU Anesthesia Type: General Level of consciousness: awake and alert Pain management: pain level controlled Vital Signs Assessment: post-procedure vital signs reviewed and stable Respiratory status: spontaneous breathing, nonlabored ventilation and respiratory function stable Cardiovascular status: blood pressure returned to baseline and stable Postop Assessment: no apparent nausea or vomiting Anesthetic complications: no     Last Vitals:  Vitals:   09/17/19 1517 09/17/19 1611  BP: 117/60 123/65  Pulse: 79 88  Resp: 16 18  Temp: 36.5 C 36.7 C  SpO2: 99% 98%    Last Pain:  Vitals:   09/17/19 1633  TempSrc:   PainSc: 0-No pain                 Durenda Hurt

## 2019-09-19 LAB — SURGICAL PATHOLOGY

## 2019-09-22 NOTE — Discharge Summary (Signed)
Gynecology Discharge Summary  Patient ID: Jessica Cisneros MRN: [id number] DOB/AGE: 12/14/1975 43 y.o.  Admit Date: 09/17/2019 Discharge Date: 09/22/2019  Admission Diagnoses:  Anemia from vaginal bleeding, thickened endometrium, morbid obesity Discharge Diagnoses: same  Procedures: Procedure(s) (LRB): DILATATION AND CURETTAGE (N/A) Transfusion of 1u PRBC  CBC Latest Ref Rng & Units 09/17/2019 09/17/2019 09/16/2019  WBC 4.0 - 10.5 K/uL 9.7 - 12.4(H)  Hemoglobin 12.0 - 15.0 g/dL 7.8(L) 7.6(L) 8.0(L)  Hematocrit 36.0 - 46.0 % 26.0(L) 26.8(L) 27.5(L)  Platelets 150 - 400 K/uL 424(H) - 534(H)    Hospital Course:  Jessica Cisneros is a 43 y.o.  admitted from the ED with anemia and thickened endometrium with heavy vaginal bleeding.  She was given 1 unit PRBC and then consented for dilation and curettage.  She underwent the procedures as mentioned above, her operation was uncomplicated. For further details about surgery, please refer to the operative report. Patient had an uncomplicated postoperative course. By time of discharge on POD#0, her pain was controlled on oral pain medications; she was ambulating, voiding without difficulty, tolerating regular diet and passing flatus. She was deemed stable for discharge to home.   Discharge Exam: BP 123/65 (BP Location: Right Arm)   Pulse 88   Temp 98 F (36.7 C) (Axillary)   Resp 18   Ht 5\' 4"  (1.626 m)   Wt 136.1 kg   LMP 08/27/2019   SpO2 98% Comment: Room Air  BMI 51.49 kg/m  General appearance: alert and no distress  Resp: clear to auscultation bilaterally, normal respiratory effort Cardio: regular rate and rhythm  GI: soft, non-tender; bowel sounds normal; no masses, no organomegaly.  Incision: C/D/I, no erythema, no drainage noted Pelvic: scant blood on pad  Extremities: extremities normal, atraumatic, no cyanosis or edema and Homans sign is negative, no sign of DVT  Discharged Condition: Stable  Disposition:   Discharge Instructions    Diet - low sodium heart healthy   Complete by: As directed    Increase activity slowly   Complete by: As directed      Allergies as of 09/17/2019   No Known Allergies     Medication List    TAKE these medications   medroxyPROGESTERone 10 MG tablet Commonly known as: PROVERA Take 1 tablet (10 mg total) by mouth 2 (two) times daily.      Follow-up Information    Ward, Honor Loh, MD Follow up in 3 week(s).   Specialty: Obstetrics and Gynecology Contact information: [place] [address] [phone]           Signed:  [place] Attending [facility] [facility]

## 2019-09-22 NOTE — Op Note (Signed)
Operative Report Dilation and Curettage 09/17/2019  Patient:  Jessica Cisneros  43 y.o. female Preoperative diagnosis:  ABNORMAL BLEEDING, thickened endometrium Postoperative diagnosis:  same  PROCEDURE:  Procedure(s): DILATATION AND CURETTAGE (N/A) Surgeon:  Juliann Mule) and Role:    * Stepahnie Campo, Honor Loh, MD - Primary Anesthesia:  GET I/O: see flowsheet Specimens:  Endometrial curettings Complications: None Apparent Disposition:  VS stable to PACU  Findings: Uterus, mobile, normal size, sounding to 10 cm; normal cervix, vagina, perineum. Copious tissue on curettage  Indication for procedure/Consents: 43 y.o.  Morbidly obese female who presented to the ED with anemia requiring a transfusion and found to have thickened endometrium.  She was seen 2 years ago for this exact same issue and was lost to follow up.  Risks of surgery were discussed with the patient including but not limited to: bleeding which may require transfusion; infection which may require antibiotics; injury to uterus or surrounding organs; intrauterine scarring which may impair future fertility; need for additional procedures including laparotomy or laparoscopy; and other postoperative/anesthesia complications. Written informed consent was obtained.    Procedure Details:   The patient was then taken to the operating room where anesthesia was administered and was found to be adequate.  After a formal timeout was performed, she was placed in the dorsal lithotomy position and examined with the above findings. She was then prepped and draped in the sterile manner.  A speculum was then placed in the patient's vagina and a single tooth tenaculum was applied to the anterior lip of the cervix.    The uterus was sounded to 10cm. Her cervix was serially dilated. A sharp curettage was then performed until there was a gritty texture in all four quadrants. The specimen was handed off to nursing.  The tenaculum was removed from the anterior lip of  the cervix and the vaginal speculum was removed after noting good hemostasis. The patient tolerated the procedure well and was taken to the recovery area awake, extubated and in stable condition.  The patient will be discharged to home as per PACU criteria.  Routine postoperative instructions given. She will follow up in the clinic in two to four weeks for postoperative evaluation.  Larey Days, MD [facility] Attending Gynecologist

## 2024-09-07 ENCOUNTER — Other Ambulatory Visit: Payer: Self-pay

## 2024-09-07 ENCOUNTER — Emergency Department

## 2024-09-07 ENCOUNTER — Emergency Department
Admission: EM | Admit: 2024-09-07 | Discharge: 2024-09-08 | Disposition: A | Discharge status: Home / Self Care | Attending: Emergency Medicine | Admitting: Emergency Medicine

## 2024-09-07 DIAGNOSIS — R2990 Unspecified symptoms and signs involving the nervous system: Secondary | ICD-10-CM

## 2024-09-07 DIAGNOSIS — R4781 Slurred speech: Secondary | ICD-10-CM | POA: Insufficient documentation

## 2024-09-07 DIAGNOSIS — R791 Abnormal coagulation profile: Secondary | ICD-10-CM | POA: Diagnosis not present

## 2024-09-07 DIAGNOSIS — R29810 Facial weakness: Secondary | ICD-10-CM | POA: Diagnosis not present

## 2024-09-07 DIAGNOSIS — R202 Paresthesia of skin: Secondary | ICD-10-CM | POA: Insufficient documentation

## 2024-09-07 LAB — COMPREHENSIVE METABOLIC PANEL WITH GFR
ALT: 22 U/L (ref 0–44)
AST: 27 U/L (ref 15–41)
Albumin: 3.4 g/dL — ABNORMAL LOW (ref 3.5–5.0)
Alkaline Phosphatase: 95 U/L (ref 38–126)
Anion gap: 9 (ref 5–15)
BUN: 7 mg/dL (ref 6–20)
CO2: 28 mmol/L (ref 22–32)
Calcium: 8.5 mg/dL — ABNORMAL LOW (ref 8.9–10.3)
Chloride: 101 mmol/L (ref 98–111)
Creatinine, Ser: 0.76 mg/dL (ref 0.44–1.00)
GFR, Estimated: >60 mL/min (ref >60)
Glucose, Bld: 104 mg/dL — ABNORMAL HIGH (ref 70–99)
Potassium: 2.9 mmol/L — ABNORMAL LOW (ref 3.5–5.1)
Sodium: 138 mmol/L (ref 135–145)
Total Bilirubin: 1.3 mg/dL — ABNORMAL HIGH (ref 0.0–1.2)
Total Protein: 7.2 g/dL (ref 6.5–8.1)

## 2024-09-07 LAB — CBC
HCT: 41 % (ref 36.0–46.0)
Hemoglobin: 14 g/dL (ref 12.0–15.0)
MCH: 29 pg (ref 26.0–34.0)
MCHC: 34.1 g/dL (ref 30.0–36.0)
MCV: 84.9 fL (ref 80.0–100.0)
Platelets: 349 K/uL (ref 150–400)
RBC: 4.83 MIL/uL (ref 3.87–5.11)
RDW: 14 % (ref 11.5–15.5)
WBC: 6.9 K/uL (ref 4.0–10.5)
nRBC: 0 % (ref 0.0–0.2)

## 2024-09-07 LAB — DIFFERENTIAL
Abs Immature Granulocytes: 0.02 K/uL (ref 0.00–0.07)
Basophils Absolute: 0.1 K/uL (ref 0.0–0.1)
Basophils Relative: 1 %
Eosinophils Absolute: 0.1 K/uL (ref 0.0–0.5)
Eosinophils Relative: 2 %
Immature Granulocytes: 0 %
Lymphocytes Relative: 31 %
Lymphs Abs: 2.1 K/uL (ref 0.7–4.0)
Monocytes Absolute: 0.5 K/uL (ref 0.1–1.0)
Monocytes Relative: 7 %
Neutro Abs: 4.1 K/uL (ref 1.7–7.7)
Neutrophils Relative %: 59 %

## 2024-09-07 LAB — CBG MONITORING, ED: Glucose-Capillary: 94 mg/dL (ref 70–99)

## 2024-09-07 LAB — PROTIME-INR
INR: 1 (ref 0.8–1.2)
Prothrombin Time: 14 s (ref 11.4–15.2)

## 2024-09-07 LAB — ETHANOL: Alcohol, Ethyl (B): <15 mg/dL (ref <15)

## 2024-09-07 LAB — APTT: aPTT: 31 s (ref 24–36)

## 2024-09-07 MED ORDER — POTASSIUM CHLORIDE CRYS ER 20 MEQ PO TBCR
40.0000 meq | EXTENDED_RELEASE_TABLET | Freq: Once | ORAL | Status: AC
  Administered 2024-09-08: 40 meq via ORAL
  Filled 2024-09-07: qty 2

## 2024-09-07 NOTE — ED Triage Notes (Signed)
 Patient wheeled to triage after being brought in via [facility] from home. Patient states she felt like she was talking funny just before 7pm when she was on the phone with her husband. She checked about an hour or so later and felt the left side of her face was drooped. Normal on EMS arrival. No droop/deficits noticed on arrival to this ER. No weakness/sensation changes at this time. Denies use of blood thinners.

## 2024-09-07 NOTE — ED Provider Notes (Signed)
 [facility] Provider Note    Event Date/Time   First MD Initiated Contact with Patient 09/07/24 2257     (approximate)   History   Facial Droop   HPI Jessica Cisneros is a 48 y.o. 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   She presents for transient facial droop with some abnormal speech.  She says that tonight she was on the phone with her husband who is in a nursing rehab facility.  She was speaking with him for an extended period of time at around 7 PM and felt like she was having trouble forming some of her words or that her speech was slurred.  This continued until she got off the phone which was around 8 PM.  Sometime around that time she went to the mirror and looked at her face and felt like she had significant left-sided facial droop.  She has had no pain in her head, neck, or face.  She felt like she had some numbness in the left side of her face as well but no numbness or weakness in her arms or her legs.  No difficulty with finding her words or expressing herself.  She called her daughters and talk to them and then called EMS.  When EMS arrived, they told her that they could not appreciate any facial droop or speech abnormalities and the patient said that she feels back to normal and felt back to normal by the time she got to the emergency department.  At the time that I saw her (right at the start of my shift), it has been approximately 4 hours since she first noticed the symptoms.     Physical Exam   Triage Vital Signs: ED Triage Vitals  Encounter Vitals Group     BP 09/07/24 2100 131/87     Girls Systolic BP Percentile --      Girls Diastolic BP Percentile --      Boys Systolic BP Percentile --      Boys Diastolic BP Percentile --      Pulse Rate 09/07/24 2100 78     Resp 09/07/24 2100 18     Temp  09/07/24 2100 98.2 F (36.8 C)     Temp Source 09/07/24 2100 Oral     SpO2 09/07/24 2100 100 %     Weight 09/07/24 2101 136.1 kg (300 lb)     Height 09/07/24 2101 1.626 m (5' 4)     Head Circumference --      Peak Flow --      Pain Score 09/07/24 2101 0     Pain Loc --      Pain Education --      Exclude from Growth Chart --     Most recent vital signs: Vitals:   09/07/24 2330 09/08/24 0000  BP: 125/61 (!) 122/53  Pulse: 63 62  Resp:  18  Temp:    SpO2: 100% 100%    General: Awake, no distress.  Patient is somewhat disheveled but otherwise appropriate, pleasant and conversant, normal affect. CV:  Good peripheral perfusion.  Resp:  Normal effort. Speaking easily and comfortably, no accessory muscle usage nor intercostal retractions.   Abd:  No distention.  Other:  Neuroexam is intact.  She has no appreciable facial droop or obvious cranial nerve abnormalities.  Normal upper and  lower extremity strength that is equal bilaterally.  No evidence of dysarthria nor aphasia.  No dysmetria on finger-to-nose testing.  Alert and oriented appropriately.   ED Results / Procedures / Treatments   Labs (all labs ordered are listed, but only abnormal results are displayed) Labs Reviewed  COMPREHENSIVE METABOLIC PANEL WITH GFR - Abnormal; Notable for the following components:      Result Value   Potassium 2.9 (*)    Glucose, Bld 104 (*)    Calcium  8.5 (*)    Albumin  3.4 (*)    Total Bilirubin 1.3 (*)    All other components within normal limits  PROTIME-INR  APTT  CBC  DIFFERENTIAL  ETHANOL  CBG MONITORING, ED  POC URINE PREG, ED     RADIOLOGY See ED course for details   PROCEDURES:  Critical Care performed: No  Procedures    IMPRESSION / MDM / ASSESSMENT AND PLAN / ED COURSE  I reviewed the triage vital signs and the nursing notes.   ABCD2 Score: 3                          Differential diagnosis includes, but is not limited to, TIA, CVA, electrolyte or metabolic  abnormality, less likely acute intracranial hemorrhage or neoplasm.  Patient's presentation is most consistent with acute presentation with potential threat to life or bodily function.  Labs/studies ordered: MR brain, CMP, pro time-INR, CBC with differential, APTT, ethanol, CBG, CT head  Interventions/Medications given:  Medications  potassium chloride  SA (KLOR-CON  M) CR tablet 40 mEq (40 mEq Oral Given 09/08/24 0013)    (Note:  hospital course my include additional interventions and/or labs/studies not listed above.)   Stable vitals, reassuring physical exam, NIH stroke scale 0.  Labs notable only for hypokalemia with a potassium of 2.9 and I have ordered 40 mill equivalents of oral potassium for repletion.  I independently viewed and interpreted the patient's CT head and I see no evidence of acute intracranial hemorrhage nor obvious evidence of CVA or neoplasm.  Given the patient's symptoms which could represent a small stroke or TIA, we discussed it and agreed to proceed with MR brain.  Will then discuss appropriate disposition.  Upon reflection, the patient said that she thinks this has happened several times over the last few weeks but having a shorter duration and perhaps milder symptoms.  It seems to only occur when she is talking with her husband on the phone.  Somatic symptom disorder is also possible.  Patient should be low risk for stroke given her age but she also has a family history of multiple sclerosis and personally has had transverse myelitis at a relatively young age which is certainly unusual.  MRI seems appropriate and after getting the results we will talk about whether she would benefit from admission for TIA workup or whether discharge and outpatient follow-up is reasonable.  However she has no primary care doctor now and has not seen a physician in at least 5 years by her estimation so she would at least benefit from close outpatient follow-up   Clinical Course as of  09/08/24 0111  Mon Sep 08, 2024  0106 MR BRAIN WO CONTRAST I independently viewed and interpreted the patient's MR brain and I see no evidence of CVA.  Radiologist report confirms no evidence of acute ischemia. [CF]  0106 I reassessed the patient and she remains asymptomatic and has been so for the 4+ hours that she has  been in the emergency department. ABCD2 score of 3 at the most is reassuring and places her in a low risk category.  I considered hospitalization but I think that a stroke workup for her low risk and reassuring medical screening exam would not be beneficial for her.  We talked about this and she agrees with the plan of outpatient follow-up.  I have referred her to the primary care system but also reminded her she can go back to [facility] where she went previously for her primary care.  I gave my usual and customary return precautions should she develop new or worsening symptoms that concern her and she understands and agrees with the plan [CF]    Clinical Course User Index [CF] Gordan Huxley, MD     FINAL CLINICAL IMPRESSION(S) / ED DIAGNOSES   Final diagnoses:  Stroke-like symptoms     Rx / DC Orders   ED Discharge Orders          Ordered    Ambulatory Referral to Primary Care (Establish Care)        09/08/24 0110             Note:  This document was prepared using Dragon voice recognition software and may include unintentional dictation errors.   Gordan Huxley, MD 09/08/24 0111

## 2024-09-08 NOTE — Discharge Instructions (Signed)
 Your workup in the Emergency Department today was reassuring.  We did not find any specific abnormalities.  We recommend you drink plenty of fluids, take your regular medications and/or any new ones prescribed today, and follow up with the doctor(s) listed in these documents as recommended.  Return to the Emergency Department if you develop new or worsening symptoms that concern you.
